# Patient Record
Sex: Female | Born: 2005
Health system: Southern US, Community
[De-identification: ages and names within clinical notes are randomized; demographics above are authoritative.]

## PROBLEM LIST (undated history)

## (undated) DIAGNOSIS — Z789 Other specified health status: Secondary | ICD-10-CM

## (undated) HISTORY — PX: TONSILLECTOMY: SUR1361

## (undated) HISTORY — PX: ADENOIDECTOMY: SUR15

## (undated) HISTORY — PX: NO PAST SURGERIES: SHX2092

## (undated) HISTORY — PX: NASAL SEPTUM SURGERY: SHX37

---
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
## 2023-07-15 ENCOUNTER — Ambulatory Visit (INDEPENDENT_AMBULATORY_CARE_PROVIDER_SITE_OTHER): Payer: 59

## 2023-07-15 VITALS — BP 114/56 | HR 64 | Ht 68.0 in | Wt 172.6 lb

## 2023-07-15 DIAGNOSIS — Z3201 Encounter for pregnancy test, result positive: Secondary | ICD-10-CM

## 2023-07-15 DIAGNOSIS — Z3A15 15 weeks gestation of pregnancy: Secondary | ICD-10-CM

## 2023-07-15 LAB — POCT URINE PREGNANCY: Preg Test, Ur: POSITIVE — AB

## 2023-07-15 NOTE — Progress Notes (Signed)
Susan Henderson here for a UPT. Pt had a positive upt at home. LMP is 03/31/2023 .     UPT in office Positive.    Reviewed medications and informed to start a PNV, if not already. Pt to follow up for New OB intake. Anatomy scan order placed given gestation age.

## 2023-07-27 ENCOUNTER — Inpatient Hospital Stay (HOSPITAL_COMMUNITY)
Admission: AD | Admit: 2023-07-27 | Discharge: 2023-07-28 | Disposition: A | Discharge status: Home / Self Care | Payer: 59 | Attending: Obstetrics & Gynecology | Admitting: Obstetrics & Gynecology

## 2023-07-27 DIAGNOSIS — Z3A17 17 weeks gestation of pregnancy: Secondary | ICD-10-CM | POA: Diagnosis not present

## 2023-07-27 DIAGNOSIS — S63502A Unspecified sprain of left wrist, initial encounter: Secondary | ICD-10-CM | POA: Diagnosis not present

## 2023-07-27 DIAGNOSIS — S66912A Strain of unspecified muscle, fascia and tendon at wrist and hand level, left hand, initial encounter: Secondary | ICD-10-CM | POA: Diagnosis not present

## 2023-07-27 DIAGNOSIS — O9A212 Injury, poisoning and certain other consequences of external causes complicating pregnancy, second trimester: Secondary | ICD-10-CM | POA: Diagnosis present

## 2023-07-27 DIAGNOSIS — W010XXA Fall on same level from slipping, tripping and stumbling without subsequent striking against object, initial encounter: Secondary | ICD-10-CM | POA: Diagnosis not present

## 2023-07-27 DIAGNOSIS — R0781 Pleurodynia: Secondary | ICD-10-CM | POA: Diagnosis not present

## 2023-07-27 DIAGNOSIS — W19XXXA Unspecified fall, initial encounter: Secondary | ICD-10-CM

## 2023-07-27 DIAGNOSIS — Z711 Person with feared health complaint in whom no diagnosis is made: Secondary | ICD-10-CM

## 2023-07-27 HISTORY — DX: Other specified health status: Z78.9
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
## 2023-07-28 ENCOUNTER — Inpatient Hospital Stay (HOSPITAL_COMMUNITY): Payer: 59

## 2023-07-28 ENCOUNTER — Encounter (HOSPITAL_COMMUNITY): Payer: Self-pay

## 2023-07-28 DIAGNOSIS — S66912A Strain of unspecified muscle, fascia and tendon at wrist and hand level, left hand, initial encounter: Secondary | ICD-10-CM | POA: Diagnosis not present

## 2023-07-28 DIAGNOSIS — Z3A17 17 weeks gestation of pregnancy: Secondary | ICD-10-CM

## 2023-07-28 DIAGNOSIS — O9A212 Injury, poisoning and certain other consequences of external causes complicating pregnancy, second trimester: Secondary | ICD-10-CM | POA: Diagnosis not present

## 2023-07-28 DIAGNOSIS — S63502A Unspecified sprain of left wrist, initial encounter: Secondary | ICD-10-CM

## 2023-07-28 DIAGNOSIS — W19XXXA Unspecified fall, initial encounter: Secondary | ICD-10-CM | POA: Diagnosis not present

## 2023-07-28 LAB — URINALYSIS, ROUTINE W REFLEX MICROSCOPIC
Bilirubin Urine: NEGATIVE
Glucose, UA: NEGATIVE mg/dL
Hgb urine dipstick: NEGATIVE
Ketones, ur: NEGATIVE mg/dL
Leukocytes,Ua: NEGATIVE
Nitrite: NEGATIVE
Protein, ur: NEGATIVE mg/dL
Specific Gravity, Urine: 1.025 (ref 1.005–1.030)
pH: 5 (ref 5.0–8.0)

## 2023-07-28 MED ORDER — CYCLOBENZAPRINE HCL 5 MG PO TABS
10.0000 mg | ORAL_TABLET | Freq: Once | ORAL | Status: AC
  Administered 2023-07-28: 10 mg via ORAL
  Filled 2023-07-28: qty 2

## 2023-07-28 MED ORDER — ACETAMINOPHEN 500 MG PO TABS
1000.0000 mg | ORAL_TABLET | Freq: Once | ORAL | Status: AC
  Administered 2023-07-28: 1000 mg via ORAL
  Filled 2023-07-28: qty 2

## 2023-07-28 NOTE — MAU Provider Note (Signed)
History     CSN: [id number]  Arrival date and time: 07/27/23 2215   Event Date/Time   First Provider Initiated Contact with Patient 07/28/23 0050      Chief Complaint  Patient presents with   Susan Henderson , a  17 y.o. G1P0 at [redacted]w[redacted]d presents to MAU with complaints of wrist and rib pain after a fall at work. Patient states that she slipped on water at work and fell on her butt. She states that when she landed she felt "the baby jump up into her rib cage." And since then she has been having rib pain and wrist pain. She states that she can barely move her wrist and it hurts to bend it. She denies attempting to relieve symptoms. She also denies vaginal bleeding, leaking of fluid, abnormal vagina discharge or urinary symptoms.        Fall Associated symptoms include abdominal pain. Pertinent negatives include no fever, headaches, nausea or vomiting.    OB History     Gravida  1   Para      Term      Preterm      AB      Living         SAB      IAB      Ectopic      Multiple      Live Births              No past medical history on file.  No past surgical history on file.  Family History  Problem Relation Age of Onset   Diabetes Maternal Grandfather     Social History   Tobacco Use   Smoking status: Never   Smokeless tobacco: Never  Vaping Use   Vaping status: Former  Substance Use Topics   Alcohol use: Not Currently   Drug use: Not Currently    Allergies: No Known Allergies  Medications Prior to Admission  Medication Sig Dispense Refill Last Dose   omeprazole (PRILOSEC) 20 MG capsule Take 1 capsule (20 mg total) by mouth daily. (Patient not taking: Reported on 07/15/2023) 30 capsule 0    Prenatal Vit-Fe Fumarate-FA (PRENATAL VITAMINS PO) Take by mouth.      sucralfate (CARAFATE) 1 GM/10ML suspension Take 10 mLs (1 g total) by mouth 4 (four) times daily -  with meals and at bedtime. (Patient not taking: Reported on 07/15/2023) 420 mL 0      Review of Systems  Constitutional:  Negative for chills, fatigue and fever.  Eyes:  Negative for pain and visual disturbance.  Respiratory:  Negative for apnea, shortness of breath and wheezing.   Cardiovascular:  Negative for chest pain and palpitations.  Gastrointestinal:  Positive for abdominal pain. Negative for constipation, diarrhea, nausea and vomiting.  Genitourinary:  Negative for difficulty urinating, dysuria, pelvic pain, vaginal bleeding, vaginal discharge and vaginal pain.  Musculoskeletal:  Negative for back pain.  Neurological:  Negative for seizures, weakness and headaches.  Psychiatric/Behavioral:  Negative for suicidal ideas.    Physical Exam   Blood pressure 118/65, pulse 86, temperature 98.9 F (37.2 C), temperature source Oral, resp. rate 15, height 5\' 8"  (1.727 m), weight 82.2 kg, last menstrual period 03/26/2023, SpO2 98%.  Physical Exam Vitals and nursing note reviewed.  Constitutional:      General: She is not in acute distress.    Appearance: Normal appearance.  HENT:     Head: Normocephalic.  Pulmonary:     Effort:  Pulmonary effort is normal.  Abdominal:     Palpations: Abdomen is soft.     Comments: No bruising noted to ribs or abdomen.   Musculoskeletal:     Left wrist: Swelling and tenderness present.       Arms:     Cervical back: Normal range of motion.     Comments: Tenderness to palpation noted on exam. Mild swelling noted. Patient able to wiggle fingers with ease.   Skin:    General: Skin is warm and dry.  Neurological:     Mental Status: She is alert and oriented to person, place, and time.  Psychiatric:        Mood and Affect: Mood normal.    FHT obtained in triage.   MAU Course  Procedures Orders Placed This Encounter  Procedures   DG Wrist Complete Left   Urinalysis, Routine w reflex microscopic -Urine, Clean Catch   Meds ordered this encounter  Medications   acetaminophen (TYLENOL) tablet 1,000 mg   cyclobenzaprine  (FLEXERIL) tablet 10 mg   Results for orders placed or performed during the hospital encounter of 07/27/23 (from the past 24 hour(s))  Urinalysis, Routine w reflex microscopic -Urine, Clean Catch     Status: Abnormal   Collection Time: 07/27/23 11:02 PM  Result Value Ref Range   Color, Urine YELLOW YELLOW   APPearance HAZY (A) CLEAR   Specific Gravity, Urine 1.025 1.005 - 1.030   pH 5.0 5.0 - 8.0   Glucose, UA NEGATIVE NEGATIVE mg/dL   Hgb urine dipstick NEGATIVE NEGATIVE   Bilirubin Urine NEGATIVE NEGATIVE   Ketones, ur NEGATIVE NEGATIVE mg/dL   Protein, ur NEGATIVE NEGATIVE mg/dL   Nitrite NEGATIVE NEGATIVE   Leukocytes,Ua NEGATIVE NEGATIVE   RBC / HPF 0-5 0 - 5 RBC/hpf   WBC, UA 6-10 0 - 5 WBC/hpf   Bacteria, UA RARE (A) NONE SEEN   Squamous Epithelial / HPF 0-5 0 - 5 /HPF   Mucus PRESENT    DG Wrist Complete Left  Result Date: 07/28/2023 CLINICAL DATA:  Recent fall with wrist pain, initial encounter EXAM: LEFT WRIST - COMPLETE 3+ VIEW COMPARISON:  12/15/2022 FINDINGS: There is no evidence of fracture or dislocation. There is no evidence of arthropathy or other focal bone abnormality. Soft tissues are unremarkable. IMPRESSION: No acute abnormality noted. Electronically Signed   By: Alcide Clever M.D.   On: 07/28/2023 01:31    MDM - No apparent break or fracture noted. Low suspicion for Broken bone. - plan for discharge.   Assessment and Plan   1. Sprain and strain of left wrist   2. [redacted] weeks gestation of pregnancy   3. Fall, initial encounter   4. Physically well but worried    - Reviewed results of likely wrist sprain from bracing her fall. Wrapped with ACE bandages prior to discharge.  - Encouraged the RICE method for comfort over the next few days.  - Also reviewed soreness expectations following fall. Encouraged warm epsom salt baths and PO Tylenol as needed for pain.  - Worsening signs and return precautions reviewed.  - Patient discharged home in stable condition and  may return to MAU as needed.    Claudette Head, MSN CNM  07/28/2023, 12:50 AM

## 2023-08-05 ENCOUNTER — Other Ambulatory Visit (HOSPITAL_COMMUNITY)
Admission: RE | Admit: 2023-08-05 | Discharge: 2023-08-05 | Disposition: A | Discharge status: Home / Self Care | Payer: 59 | Source: Ambulatory Visit | Attending: Obstetrics and Gynecology | Admitting: Obstetrics and Gynecology

## 2023-08-05 ENCOUNTER — Ambulatory Visit (INDEPENDENT_AMBULATORY_CARE_PROVIDER_SITE_OTHER): Payer: 59 | Admitting: Licensed Clinical Social Worker

## 2023-08-05 ENCOUNTER — Ambulatory Visit (INDEPENDENT_AMBULATORY_CARE_PROVIDER_SITE_OTHER): Payer: 59 | Admitting: Obstetrics and Gynecology

## 2023-08-05 ENCOUNTER — Encounter: Payer: Self-pay | Admitting: Obstetrics and Gynecology

## 2023-08-05 VITALS — BP 112/68 | HR 88 | Wt 185.0 lb

## 2023-08-05 DIAGNOSIS — Z3402 Encounter for supervision of normal first pregnancy, second trimester: Secondary | ICD-10-CM | POA: Insufficient documentation

## 2023-08-05 DIAGNOSIS — Z3A18 18 weeks gestation of pregnancy: Secondary | ICD-10-CM | POA: Diagnosis not present

## 2023-08-05 DIAGNOSIS — F4322 Adjustment disorder with anxiety: Secondary | ICD-10-CM

## 2023-08-05 DIAGNOSIS — Z3481 Encounter for supervision of other normal pregnancy, first trimester: Secondary | ICD-10-CM

## 2023-08-05 DIAGNOSIS — Z3403 Encounter for supervision of normal first pregnancy, third trimester: Secondary | ICD-10-CM | POA: Insufficient documentation
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
## 2023-08-06 LAB — CBC/D/PLT+RPR+RH+ABO+RUBIGG...
Antibody Screen: NEGATIVE
Basophils Absolute: 0 10*3/uL (ref 0.0–0.2)
Basos: 0 %
EOS (ABSOLUTE): 0 10*3/uL (ref 0.0–0.4)
Eos: 0 %
HCV Ab: NONREACTIVE
HIV Screen 4th Generation wRfx: NONREACTIVE
Hematocrit: 33.3 % — ABNORMAL LOW (ref 34.0–46.6)
Hemoglobin: 11.2 g/dL (ref 11.1–15.9)
Hepatitis B Surface Ag: NEGATIVE
Immature Grans (Abs): 0 10*3/uL (ref 0.0–0.1)
Immature Granulocytes: 0 %
Lymphocytes Absolute: 1.2 10*3/uL (ref 0.7–3.1)
Lymphs: 16 %
MCH: 30.4 pg (ref 26.6–33.0)
MCHC: 33.6 g/dL (ref 31.5–35.7)
MCV: 91 fL (ref 79–97)
Monocytes Absolute: 0.5 10*3/uL (ref 0.1–0.9)
Monocytes: 7 %
Neutrophils Absolute: 5.5 10*3/uL (ref 1.4–7.0)
Neutrophils: 77 %
Platelets: 194 10*3/uL (ref 150–450)
RBC: 3.68 x10E6/uL — ABNORMAL LOW (ref 3.77–5.28)
RDW: 12.7 % (ref 11.7–15.4)
RPR Ser Ql: NONREACTIVE
Rh Factor: POSITIVE
Rubella Antibodies, IGG: 1.07 {index} (ref >0.99)
WBC: 7.3 10*3/uL (ref 3.4–10.8)

## 2023-08-06 LAB — HCV INTERPRETATION

## 2023-08-07 LAB — AFP, SERUM, OPEN SPINA BIFIDA
AFP MoM: 0.67
AFP Value: 27.3 ng/mL
Gest. Age on Collection Date: 18.6 wk
Maternal Age At EDD: 19.2 a
OSBR Risk 1 IN: 10000
Test Results:: NEGATIVE
Weight: 185 [lb_av]

## 2023-08-07 LAB — CULTURE, OB URINE

## 2023-08-07 LAB — CERVICOVAGINAL ANCILLARY ONLY
Chlamydia: POSITIVE — AB
Comment: NEGATIVE
Comment: NORMAL
Neisseria Gonorrhea: NEGATIVE

## 2023-08-07 LAB — URINE CULTURE, OB REFLEX
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
## 2023-08-08 ENCOUNTER — Encounter: Payer: Self-pay | Admitting: Obstetrics and Gynecology

## 2023-08-08 DIAGNOSIS — O98319 Other infections with a predominantly sexual mode of transmission complicating pregnancy, unspecified trimester: Secondary | ICD-10-CM | POA: Insufficient documentation

## 2023-08-08 MED ORDER — AZITHROMYCIN 500 MG PO TABS
1000.0000 mg | ORAL_TABLET | Freq: Once | ORAL | 0 refills | Status: AC
Start: 2023-08-08 — End: 2023-08-08

## 2023-08-08 NOTE — Addendum Note (Signed)
Addended by: Harvie Bridge on: 08/08/2023 07:40 PM   Modules accepted: Orders

## 2023-08-11 LAB — PANORAMA PRENATAL TEST FULL PANEL:PANORAMA TEST PLUS 5 ADDITIONAL MICRODELETIONS: FETAL FRACTION: 7

## 2023-08-12 LAB — HORIZON CUSTOM: REPORT SUMMARY: NEGATIVE

## 2023-08-14 ENCOUNTER — Telehealth: Payer: Self-pay

## 2023-08-14 ENCOUNTER — Ambulatory Visit: Payer: 59 | Admitting: *Deleted

## 2023-08-14 ENCOUNTER — Other Ambulatory Visit: Payer: Self-pay | Admitting: *Deleted

## 2023-08-14 ENCOUNTER — Ambulatory Visit: Payer: 59 | Attending: Obstetrics & Gynecology

## 2023-08-14 VITALS — BP 118/61 | HR 82

## 2023-08-14 DIAGNOSIS — Z362 Encounter for other antenatal screening follow-up: Secondary | ICD-10-CM

## 2023-08-14 DIAGNOSIS — Z363 Encounter for antenatal screening for malformations: Secondary | ICD-10-CM | POA: Diagnosis not present

## 2023-08-14 DIAGNOSIS — Z3201 Encounter for pregnancy test, result positive: Secondary | ICD-10-CM | POA: Diagnosis not present

## 2023-08-14 DIAGNOSIS — Z3A15 15 weeks gestation of pregnancy: Secondary | ICD-10-CM

## 2023-08-14 DIAGNOSIS — Z3A20 20 weeks gestation of pregnancy: Secondary | ICD-10-CM | POA: Insufficient documentation

## 2023-08-14 DIAGNOSIS — Z3402 Encounter for supervision of normal first pregnancy, second trimester: Secondary | ICD-10-CM | POA: Insufficient documentation

## 2023-08-14 NOTE — Telephone Encounter (Signed)
Returned call and advised pt of +CT results, rx sent, treatment plan, follow up testing, and partner treatment. Pt voiced understanding.

## 2023-08-22 ENCOUNTER — Encounter (HOSPITAL_COMMUNITY): Payer: Self-pay | Admitting: Obstetrics and Gynecology

## 2023-08-22 ENCOUNTER — Inpatient Hospital Stay (HOSPITAL_COMMUNITY)
Admission: AD | Admit: 2023-08-22 | Discharge: 2023-08-22 | Disposition: A | Discharge status: Home / Self Care | Payer: 59 | Attending: Obstetrics and Gynecology | Admitting: Obstetrics and Gynecology

## 2023-08-22 DIAGNOSIS — O2342 Unspecified infection of urinary tract in pregnancy, second trimester: Secondary | ICD-10-CM

## 2023-08-22 DIAGNOSIS — Z3402 Encounter for supervision of normal first pregnancy, second trimester: Secondary | ICD-10-CM

## 2023-08-22 DIAGNOSIS — R1030 Lower abdominal pain, unspecified: Secondary | ICD-10-CM | POA: Diagnosis present

## 2023-08-22 DIAGNOSIS — O26892 Other specified pregnancy related conditions, second trimester: Secondary | ICD-10-CM | POA: Diagnosis not present

## 2023-08-22 DIAGNOSIS — N390 Urinary tract infection, site not specified: Secondary | ICD-10-CM | POA: Insufficient documentation

## 2023-08-22 DIAGNOSIS — R1033 Periumbilical pain: Secondary | ICD-10-CM | POA: Diagnosis not present

## 2023-08-22 DIAGNOSIS — Z3A21 21 weeks gestation of pregnancy: Secondary | ICD-10-CM

## 2023-08-22 LAB — URINALYSIS, ROUTINE W REFLEX MICROSCOPIC
Bilirubin Urine: NEGATIVE
Glucose, UA: NEGATIVE mg/dL
Hgb urine dipstick: NEGATIVE
Ketones, ur: 20 mg/dL — AB
Nitrite: NEGATIVE
Protein, ur: NEGATIVE mg/dL
Specific Gravity, Urine: 1.027 (ref 1.005–1.030)
WBC, UA: >50 WBC/hpf (ref 0–5)
pH: 5 (ref 5.0–8.0)

## 2023-08-22 LAB — COMPREHENSIVE METABOLIC PANEL
ALT: 9 U/L (ref 0–44)
AST: 12 U/L — ABNORMAL LOW (ref 15–41)
Albumin: 3.3 g/dL — ABNORMAL LOW (ref 3.5–5.0)
Alkaline Phosphatase: 43 U/L (ref 38–126)
Anion gap: 9 (ref 5–15)
BUN: 8 mg/dL (ref 6–20)
CO2: 22 mmol/L (ref 22–32)
Calcium: 8.5 mg/dL — ABNORMAL LOW (ref 8.9–10.3)
Chloride: 104 mmol/L (ref 98–111)
Creatinine, Ser: 0.59 mg/dL (ref 0.44–1.00)
GFR, Estimated: >60 mL/min (ref >60)
Glucose, Bld: 95 mg/dL (ref 70–99)
Potassium: 3.2 mmol/L — ABNORMAL LOW (ref 3.5–5.1)
Sodium: 135 mmol/L (ref 135–145)
Total Bilirubin: 0.6 mg/dL (ref 0.3–1.2)
Total Protein: 6 g/dL — ABNORMAL LOW (ref 6.5–8.1)

## 2023-08-22 LAB — CBC
HCT: 32 % — ABNORMAL LOW (ref 36.0–46.0)
Hemoglobin: 10.7 g/dL — ABNORMAL LOW (ref 12.0–15.0)
MCH: 30.1 pg (ref 26.0–34.0)
MCHC: 33.4 g/dL (ref 30.0–36.0)
MCV: 90.1 fL (ref 80.0–100.0)
Platelets: 204 10*3/uL (ref 150–400)
RBC: 3.55 MIL/uL — ABNORMAL LOW (ref 3.87–5.11)
RDW: 13.4 % (ref 11.5–15.5)
WBC: 8.4 10*3/uL (ref 4.0–10.5)
nRBC: 0 % (ref 0.0–0.2)

## 2023-08-22 LAB — LIPASE, BLOOD: Lipase: 24 U/L (ref 11–51)

## 2023-08-22 LAB — AMYLASE: Amylase: 67 U/L (ref 28–100)

## 2023-08-22 LAB — WET PREP, GENITAL
Clue Cells Wet Prep HPF POC: NONE SEEN
Sperm: NONE SEEN
Trich, Wet Prep: NONE SEEN
WBC, Wet Prep HPF POC: >=10 — AB (ref <10)
Yeast Wet Prep HPF POC: NONE SEEN

## 2023-08-22 MED ORDER — ACETAMINOPHEN 500 MG PO TABS
1000.0000 mg | ORAL_TABLET | Freq: Once | ORAL | Status: AC
  Administered 2023-08-22: 1000 mg via ORAL
  Filled 2023-08-22: qty 2

## 2023-08-22 MED ORDER — NITROFURANTOIN MONOHYD MACRO 100 MG PO CAPS: 100.0000 mg | ORAL_CAPSULE | Freq: Two times a day (BID) | ORAL | 0 refills | Status: AC

## 2023-08-22 NOTE — MAU Note (Addendum)
.  Susan Henderson is a 17 y.o. at [redacted]w[redacted]d here in MAU reporting abdominal pains since 1445. Pain improved alittle earlier but now is back. Pain is sharp in lower abdomen and stays tight. Makes it alittle hard to breathe. Pain is worse with movement. She was at work and very busy and once she could rest alittle pain did decrease. Denies VB or LOF  Onset of complaint: 1445 Pain score: 8 Vitals:   08/22/23 0021 08/22/23 0025  BP:  (!) 124/59  Pulse: 61   Resp: 16   Temp: 98 F (36.7 C)   SpO2: 100%      FHT:133 Lab orders placed from triage:  u/a

## 2023-08-22 NOTE — Progress Notes (Signed)
Dr Lucianne Muss in to discuss test results and d/c plan. Written and verbal d/c instructions given and understanding voiced.

## 2023-08-22 NOTE — MAU Provider Note (Addendum)
History     CSN: [id number]  Arrival date and time: 08/22/23 0009   Event Date/Time   First Provider Initiated Contact with Patient 08/22/23 [id number]      Chief Complaint  Patient presents with   Abdominal Pain   HPI Susan Henderson is a 17 y.o. G1P0 at [redacted]w[redacted]d who presents for abdominal pain. She reports ongoing abdominal pain throughout today. She describes it as a stabbing pain that had started around 1445 and lasted until 1700, she had another episode from 1800-1900, which later subsided. She reports tenderness to touch of entire abdomen. Her cramping episodes feel like suprapubic pain that goes down to her vaginal area. She denies any vaginal bleeding, leaking of fluid, or decreased fetal movement. Has some nausea, but no fevers, chills, constipation, diarrhea, dysuria. She has not had any decreased PO intake and reports drinking approx 24oz of water total throughout the day.   Past Medical History:  Diagnosis Date   Medical history non-contributory     Past Surgical History:  Procedure Laterality Date   NO PAST SURGERIES      Family History  Problem Relation Age of Onset   Cancer Maternal Grandfather    Diabetes Maternal Grandfather    Hypertension Other     Social History   Tobacco Use   Smoking status: Never   Smokeless tobacco: Never  Vaping Use   Vaping status: Former  Substance Use Topics   Alcohol use: Not Currently   Drug use: Not Currently    Allergies: No Known Allergies  Medications Prior to Admission  Medication Sig Dispense Refill Last Dose   Prenatal Vit-Fe Fumarate-FA (PRENATAL VITAMINS PO) Take by mouth.   08/21/2023    ROS reviewed and pertinent positives and negatives as documented in HPI.  Physical Exam   Blood pressure (!) 124/59, pulse 61, temperature 98 F (36.7 C), resp. rate 16, height 5\' 8"  (1.727 m), weight 85.3 kg, last menstrual period 03/26/2023, SpO2 100%.  Physical Exam Exam conducted with a chaperone present.  Constitutional:       General: She is not in acute distress.    Appearance: Normal appearance.  HENT:     Head: Normocephalic and atraumatic.  Cardiovascular:     Rate and Rhythm: Normal rate and regular rhythm.     Heart sounds: Normal heart sounds.  Pulmonary:     Effort: Pulmonary effort is normal. No respiratory distress.     Breath sounds: Normal breath sounds.  Abdominal:     General: There is no distension.     Palpations: Abdomen is soft.     Tenderness: There is no abdominal tenderness. There is no right CVA tenderness or left CVA tenderness.     Comments: Tenderness to light palpation of suprapubic area  Genitourinary:    Vagina: Normal.     Comments: On SSE, cervix is closed with white to yellowish thick discharge Musculoskeletal:        General: Normal range of motion.  Skin:    General: Skin is warm and dry.     Findings: No rash.  Neurological:     General: No focal deficit present.     Mental Status: She is alert and oriented to person, place, and time.     MAU Course  Procedures  MDM 17 y.o. G1P0 at [redacted]w[redacted]d who presents with complaint of suprapubic tenderness that has been intermittent in nature. Vital signs are stable on arrival to MAU, exam is benign, cervix closed on SSE. Labs and  wet prep sent. Will give Tylenol, push PO hydration and reassess.  2:20 AM  Pt reports sxs better w rest, Tylenol. Labs back -- CBC, CMP, lipase wnl. Wet prep negative. UA c/f UTI -- will tx with course of Macrobid. Results d/w pt. Stable for d/c.   Assessment and Plan  UTI (urinary tract infection) during pregnancy, second trimester: suprapubic pain + UA w bacteria, sig number of leuks, neg nitrites -- will tx with Macrobid, cx sent  d/c at this time  Patient discussed with Dr. Lovie Chol, MD OB Fellow, Faculty [facility], Center for [facility]  08/22/2023, 2:21 AM

## 2023-08-23 LAB — CULTURE, OB URINE: Culture: <10000 — AB

## 2023-08-26 ENCOUNTER — Telehealth: Payer: Self-pay | Admitting: *Deleted

## 2023-08-26 NOTE — Telephone Encounter (Signed)
Breat pump order was signed and faxed back to [facility].

## 2023-09-09 DIAGNOSIS — Z3400 Encounter for supervision of normal first pregnancy, unspecified trimester: Secondary | ICD-10-CM | POA: Insufficient documentation

## 2023-09-10 ENCOUNTER — Other Ambulatory Visit (HOSPITAL_COMMUNITY)
Admission: RE | Admit: 2023-09-10 | Discharge: 2023-09-10 | Disposition: A | Discharge status: Home / Self Care | Payer: 59 | Source: Ambulatory Visit | Attending: Obstetrics and Gynecology | Admitting: Obstetrics and Gynecology

## 2023-09-10 ENCOUNTER — Ambulatory Visit (INDEPENDENT_AMBULATORY_CARE_PROVIDER_SITE_OTHER): Payer: 59 | Admitting: Obstetrics and Gynecology

## 2023-09-10 VITALS — BP 121/74 | HR 92 | Wt 193.7 lb

## 2023-09-10 DIAGNOSIS — Z113 Encounter for screening for infections with a predominantly sexual mode of transmission: Secondary | ICD-10-CM | POA: Insufficient documentation

## 2023-09-10 DIAGNOSIS — A749 Chlamydial infection, unspecified: Secondary | ICD-10-CM | POA: Insufficient documentation

## 2023-09-10 DIAGNOSIS — O99012 Anemia complicating pregnancy, second trimester: Secondary | ICD-10-CM

## 2023-09-10 DIAGNOSIS — O0992 Supervision of high risk pregnancy, unspecified, second trimester: Secondary | ICD-10-CM | POA: Diagnosis not present

## 2023-09-10 DIAGNOSIS — Z3A24 24 weeks gestation of pregnancy: Secondary | ICD-10-CM | POA: Insufficient documentation

## 2023-09-10 DIAGNOSIS — O98812 Other maternal infectious and parasitic diseases complicating pregnancy, second trimester: Secondary | ICD-10-CM | POA: Diagnosis present

## 2023-09-10 DIAGNOSIS — O99019 Anemia complicating pregnancy, unspecified trimester: Secondary | ICD-10-CM | POA: Insufficient documentation

## 2023-09-10 DIAGNOSIS — O99892 Other specified diseases and conditions complicating childbirth: Secondary | ICD-10-CM | POA: Insufficient documentation

## 2023-09-10 DIAGNOSIS — Z8619 Personal history of other infectious and parasitic diseases: Secondary | ICD-10-CM | POA: Insufficient documentation

## 2023-09-10 MED ORDER — PRENATAL 28-0.8 MG PO TABS
1.0000 | ORAL_TABLET | Freq: Every day | ORAL | 6 refills | Status: AC
Start: 2023-09-10 — End: ?

## 2023-09-10 NOTE — Progress Notes (Signed)
    PRENATAL VISIT NOTE  Subjective:  Susan Henderson is a 17 y.o. G1P0 at [redacted]w[redacted]d being seen today for ongoing prenatal care.  She is currently monitored for the following issues for this low-risk pregnancy and has Encounter for supervision of normal first pregnancy in second trimester; Chlamydia trachomatis infection in pregnancy; Supervision of normal first teen pregnancy; Anemia affecting pregnancy; and Chlamydia infection affecting pregnancy in second trimester on their problem list.  Patient reports no bleeding, no contractions, no cramping, and no leaking.  Contractions: Not present. Vag. Bleeding: None.  Movement: Present. Denies leaking of fluid.   The following portions of the patient's history were reviewed and updated as appropriate: allergies, current medications, past family history, past medical history, past social history, past surgical history and problem list.   Objective:   Vitals:   09/10/23 1335  BP: 121/74  Pulse: 92  Weight: 193 lb 11.2 oz (87.9 kg)    Fetal Status: Fetal Heart Rate (bpm): 143   Movement: Present     General:  Alert, oriented and cooperative. Patient is in no acute distress.  Skin: Skin is warm and dry. No rash noted.   Cardiovascular: Normal heart rate noted  Respiratory: Normal respiratory effort, no problems with respiration noted  Abdomen: Soft, gravid, appropriate for gestational age.  Pain/Pressure: Absent     Pelvic: Cervical exam deferred        Extremities: Normal range of motion.  Edema: Trace  Mental Status: Normal mood and affect. Normal behavior. Normal judgment and thought content.   Assessment and Plan:  Pregnancy: G1P0 at [redacted]w[redacted]d 1. Anemia affecting pregnancy in second trimester -Continue iron supplement  2. Chlamydia infection affecting pregnancy in second trimester Pt states completed therapy. Test of Cure collected. - Cervicovaginal ancillary only( Susan Henderson)  3. [redacted] weeks gestation of pregnancy -Discussed 2hr GTT/labs at  next ROB in 4 weeks  Preterm labor symptoms and general obstetric precautions including but not limited to vaginal bleeding, contractions, leaking of fluid and fetal movement were reviewed in detail with the patient. Please refer to After Visit Summary for other counseling recommendations.   Return in about 4 weeks (around 10/08/2023) for 2hr GTT, Routine prenatal visit.  Future Appointments  Date Time Provider Department Center  09/18/2023  9:30 AM [facility]    Letta Kocher, CNM

## 2023-09-11 LAB — CERVICOVAGINAL ANCILLARY ONLY
Bacterial Vaginitis (gardnerella): NEGATIVE
Candida Glabrata: NEGATIVE
Candida Vaginitis: NEGATIVE
Chlamydia: NEGATIVE
Comment: NEGATIVE
Comment: NEGATIVE
Comment: NEGATIVE
Comment: NEGATIVE
Comment: NEGATIVE
Comment: NORMAL
Neisseria Gonorrhea: NEGATIVE
Trichomonas: NEGATIVE

## 2023-09-18 ENCOUNTER — Other Ambulatory Visit: Payer: Self-pay

## 2023-09-18 ENCOUNTER — Other Ambulatory Visit: Payer: Self-pay | Admitting: *Deleted

## 2023-09-18 ENCOUNTER — Ambulatory Visit: Payer: 59 | Attending: Obstetrics

## 2023-09-18 VITALS — BP 113/52 | HR 59

## 2023-09-18 DIAGNOSIS — Z362 Encounter for other antenatal screening follow-up: Secondary | ICD-10-CM | POA: Diagnosis present

## 2023-09-18 DIAGNOSIS — O365920 Maternal care for other known or suspected poor fetal growth, second trimester, not applicable or unspecified: Secondary | ICD-10-CM | POA: Diagnosis not present

## 2023-09-18 DIAGNOSIS — O321XX0 Maternal care for breech presentation, not applicable or unspecified: Secondary | ICD-10-CM | POA: Insufficient documentation

## 2023-09-18 DIAGNOSIS — Z3400 Encounter for supervision of normal first pregnancy, unspecified trimester: Secondary | ICD-10-CM | POA: Insufficient documentation

## 2023-09-18 DIAGNOSIS — Z3A25 25 weeks gestation of pregnancy: Secondary | ICD-10-CM | POA: Diagnosis not present

## 2023-09-18 DIAGNOSIS — Z363 Encounter for antenatal screening for malformations: Secondary | ICD-10-CM | POA: Diagnosis not present

## 2023-09-18 DIAGNOSIS — O09892 Supervision of other high risk pregnancies, second trimester: Secondary | ICD-10-CM

## 2023-10-08 ENCOUNTER — Ambulatory Visit (INDEPENDENT_AMBULATORY_CARE_PROVIDER_SITE_OTHER): Payer: 59 | Admitting: Obstetrics and Gynecology

## 2023-10-08 ENCOUNTER — Other Ambulatory Visit: Payer: 59

## 2023-10-08 VITALS — BP 101/67 | HR 78 | Wt 200.0 lb

## 2023-10-08 DIAGNOSIS — Z3A28 28 weeks gestation of pregnancy: Secondary | ICD-10-CM

## 2023-10-08 DIAGNOSIS — Z3403 Encounter for supervision of normal first pregnancy, third trimester: Secondary | ICD-10-CM

## 2023-10-08 DIAGNOSIS — Z3402 Encounter for supervision of normal first pregnancy, second trimester: Secondary | ICD-10-CM

## 2023-10-08 NOTE — Progress Notes (Signed)
   PRENATAL VISIT NOTE  Subjective:  Susan Henderson is a 17 y.o. G1P0 at [redacted]w[redacted]d being seen today for ongoing prenatal care.  She is currently monitored for the following issues for this low-risk pregnancy and has Encounter for supervision of normal first pregnancy in third trimester; Chlamydia trachomatis infection in pregnancy; Supervision of normal first teen pregnancy; Anemia affecting pregnancy; and Chlamydia infection affecting pregnancy in second trimester on their problem list.  Patient doing well with no acute concerns today. She reports no complaints.  Contractions: Not present. Vag. Bleeding: None.  Movement: Present. Denies leaking of fluid.   The following portions of the patient's history were reviewed and updated as appropriate: allergies, current medications, past family history, past medical history, past social history, past surgical history and problem list. Problem list updated.  Objective:   Vitals:   10/08/23 0944  BP: 101/67  Pulse: 78  Weight: 200 lb (90.7 kg)    Fetal Status: Fetal Heart Rate (bpm): 150 Fundal Height: 27 cm Movement: Present     General:  Alert, oriented and cooperative. Patient is in no acute distress.  Skin: Skin is warm and dry. No rash noted.   Cardiovascular: Normal heart rate noted  Respiratory: Normal respiratory effort, no problems with respiration noted  Abdomen: Soft, gravid, appropriate for gestational age.  Pain/Pressure: Present     Pelvic: Cervical exam deferred        Extremities: Normal range of motion.  Edema: Trace  Mental Status:  Normal mood and affect. Normal behavior. Normal judgment and thought content.   Assessment and Plan:  Pregnancy: G1P0 at [redacted]w[redacted]d  1. Encounter for supervision of normal first pregnancy in second trimester Pt had to reschedule GTT due to chewing gum - Glucose Tolerance, 2 Hours w/1 Hour - RPR - CBC - HIV antibody (with reflex)  2. [redacted] weeks gestation of pregnancy   3. Supervision of normal  first teen pregnancy in third trimester   4. Encounter for supervision of normal first pregnancy in third trimester Continue routine prenatal care - Ambulatory referral to Integrated Behavioral Health  Preterm labor symptoms and general obstetric precautions including but not limited to vaginal bleeding, contractions, leaking of fluid and fetal movement were reviewed in detail with the patient.  Please refer to After Visit Summary for other counseling recommendations.   Return in about 2 weeks (around 10/22/2023) for ROB, in person.   Mariel Aloe, MD Faculty Attending Center for [facility]

## 2023-10-09 LAB — CBC
Hematocrit: 34.2 % (ref 34.0–46.6)
Hemoglobin: 11.1 g/dL (ref 11.1–15.9)
MCH: 29.7 pg (ref 26.6–33.0)
MCHC: 32.5 g/dL (ref 31.5–35.7)
MCV: 91 fL (ref 79–97)
Platelets: 222 10*3/uL (ref 150–450)
RBC: 3.74 x10E6/uL — ABNORMAL LOW (ref 3.77–5.28)
RDW: 12.1 % (ref 11.7–15.4)
WBC: 9.8 10*3/uL (ref 3.4–10.8)

## 2023-10-09 LAB — HIV ANTIBODY (ROUTINE TESTING W REFLEX): HIV Screen 4th Generation wRfx: NONREACTIVE

## 2023-10-09 LAB — RPR: RPR Ser Ql: NONREACTIVE

## 2023-10-10 ENCOUNTER — Other Ambulatory Visit: Payer: 59

## 2023-10-10 DIAGNOSIS — Z3A28 28 weeks gestation of pregnancy: Secondary | ICD-10-CM

## 2023-10-11 LAB — GLUCOSE TOLERANCE, 2 HOURS W/ 1HR
Glucose, 1 hour: 68 mg/dL — ABNORMAL LOW (ref 70–179)
Glucose, 2 hour: 56 mg/dL — ABNORMAL LOW (ref 70–152)
Glucose, Fasting: 69 mg/dL — ABNORMAL LOW (ref 70–91)

## 2023-10-23 ENCOUNTER — Other Ambulatory Visit: Payer: Self-pay

## 2023-10-23 ENCOUNTER — Ambulatory Visit: Payer: 59 | Attending: Maternal & Fetal Medicine

## 2023-10-23 DIAGNOSIS — O09892 Supervision of other high risk pregnancies, second trimester: Secondary | ICD-10-CM | POA: Insufficient documentation

## 2023-10-23 DIAGNOSIS — Z3A30 30 weeks gestation of pregnancy: Secondary | ICD-10-CM | POA: Diagnosis not present

## 2023-10-23 DIAGNOSIS — O358XX0 Maternal care for other (suspected) fetal abnormality and damage, not applicable or unspecified: Secondary | ICD-10-CM

## 2023-10-24 ENCOUNTER — Ambulatory Visit (INDEPENDENT_AMBULATORY_CARE_PROVIDER_SITE_OTHER): Payer: 59 | Admitting: Certified Nurse Midwife

## 2023-10-24 VITALS — BP 128/68 | HR 73 | Wt 208.6 lb

## 2023-10-24 DIAGNOSIS — O0993 Supervision of high risk pregnancy, unspecified, third trimester: Secondary | ICD-10-CM

## 2023-10-24 DIAGNOSIS — Z3A30 30 weeks gestation of pregnancy: Secondary | ICD-10-CM

## 2023-10-24 DIAGNOSIS — O09893 Supervision of other high risk pregnancies, third trimester: Secondary | ICD-10-CM

## 2023-10-24 NOTE — Progress Notes (Signed)
   PRENATAL VISIT NOTE  Subjective:  Susan Henderson is a 17 y.o. G1P0 at [redacted]w[redacted]d being seen today for ongoing prenatal care.  She is currently monitored for the following issues for this low-risk pregnancy and has Encounter for supervision of normal first pregnancy in third trimester; Chlamydia trachomatis infection in pregnancy; Supervision of normal first teen pregnancy; Anemia affecting pregnancy; and Chlamydia infection affecting pregnancy in second trimester on their problem list.  Patient reports intermittent cramping. Contractions: Not present. Vag. Bleeding: None.  Movement: Present. Denies leaking of fluid. Patient reports some DFM last Friday & she did not notice until after she left work, but reports that there has been no issues since and baby has been very active.  The following portions of the patient's history were reviewed and updated as appropriate: allergies, current medications, past family history, past medical history, past social history, past surgical history and problem list.   Objective:   Vitals:   10/24/23 1015  BP: 128/68  Pulse: 73  Weight: 94.6 kg    Fetal Status: Fetal Heart Rate (bpm): 143   Movement: Present     General:  Alert, oriented and cooperative. Patient is in no acute distress.  Skin: Skin is warm and dry. No rash noted.      Respiratory: Normal respiratory effort, no problems with respiration noted  Abdomen: Soft, gravid, appropriate for gestational age.  Pain/Pressure: Absent     Pelvic: Cervical exam deferred        Extremities: Normal range of motion.  Edema: None  Mental Status: Normal mood and affect. Normal behavior. Normal judgment and thought content.   Assessment and Plan:  Pregnancy: G1P0 at [redacted]w[redacted]d 1. Supervision of high risk pregnancy in third trimester -Reports some life stressors but states she is in good spirits and has a lot of family support  2. [redacted] weeks gestation of pregnancy -28 wks labs reviewed. Nothing to address  today.  3. High risk teen pregnancy in third trimester -Expectant management given & reviewed -Patient works long hours at a [employer]  Preterm labor symptoms and general obstetric precautions including but not limited to vaginal bleeding, contractions, leaking of fluid and fetal movement were reviewed in detail with the patient. Please refer to After Visit Summary for other counseling recommendations.   Return in about 2 weeks (around 11/07/2023) for HROB visit.  Future Appointments  Date Time Provider Department Center  11/06/2023  9:35 AM Gerrit Heck, CNM [facility] None  11/21/2023  9:55 AM Constant, Gigi Gin, MD [facility] None  12/05/2023  9:55 AM Sue Lush, FNP [facility] None  12/12/2023  9:55 AM Lennart Pall, MD [facility] None  12/19/2023  9:55 AM Allie Bossier, MD [facility] None  12/26/2023  9:55 AM Carlynn Herald, CNM [facility] None  01/02/2024  9:55 AM Lennart Pall, MD [facility] None    Darrell Jewel, Student-MidWife

## 2023-10-24 NOTE — Progress Notes (Unsigned)
Pt presents for ROB visit. Pt reports abd cramping

## 2023-10-25 ENCOUNTER — Encounter: Payer: Self-pay | Admitting: Obstetrics and Gynecology

## 2023-10-25 ENCOUNTER — Encounter: Payer: Self-pay | Admitting: Certified Nurse Midwife

## 2023-11-06 ENCOUNTER — Ambulatory Visit (INDEPENDENT_AMBULATORY_CARE_PROVIDER_SITE_OTHER): Payer: 59

## 2023-11-06 ENCOUNTER — Ambulatory Visit: Payer: Self-pay | Admitting: Licensed Clinical Social Worker

## 2023-11-06 ENCOUNTER — Telehealth: Payer: Self-pay | Admitting: Licensed Clinical Social Worker

## 2023-11-06 VITALS — BP 123/74 | HR 101 | Wt 213.8 lb

## 2023-11-06 DIAGNOSIS — Z3A32 32 weeks gestation of pregnancy: Secondary | ICD-10-CM | POA: Diagnosis not present

## 2023-11-06 DIAGNOSIS — O99013 Anemia complicating pregnancy, third trimester: Secondary | ICD-10-CM | POA: Diagnosis not present

## 2023-11-06 DIAGNOSIS — Z3403 Encounter for supervision of normal first pregnancy, third trimester: Secondary | ICD-10-CM

## 2023-11-06 DIAGNOSIS — Z91199 Patient's noncompliance with other medical treatment and regimen due to unspecified reason: Secondary | ICD-10-CM

## 2023-11-06 DIAGNOSIS — O99019 Anemia complicating pregnancy, unspecified trimester: Secondary | ICD-10-CM

## 2023-11-06 NOTE — BH Specialist Note (Signed)
Patient no showed for today's appointment. A Virtual link was sent to patient's phone. [facility] also contacted patient to remind her of the appointment and left a VM.

## 2023-11-06 NOTE — Patient Instructions (Signed)
Deciding about Circumcision in Baby Boys  (The Basics)  What is circumcision?   Circumcision is a surgery that removes the skin that covers the tip of the penis, called the "foreskin" Circumcision is usually done when a boy is between 1 and 10 days old. In the [place], circumcision is common. In some other countries, fewer boys are circumcised. Circumcision is a common tradition in some religions.  Should I have my baby boy circumcised?   There is no easy answer. Circumcision has some benefits. But it also has risks. After talking with your doctor, you will have to decide for yourself what is right for your family.  What are the benefits of circumcision?   Circumcised boys seem to have slightly lower rates of: ?Urinary tract infections ?Swelling of the opening at the tip of the penis Circumcised men seem to have slightly lower rates of: ?Urinary tract infections ?Swelling of the opening at the tip of the penis ?Penis cancer ?HIV and other infections that you catch during sex ?Cervical cancer in the women they have sex with Even so, in the [place], the risks of these problems are small - even in boys and men who have not been circumcised. Plus, boys and men who are not circumcised can reduce these extra risks by: ?Cleaning their penis well ?Using condoms during sex  What are the risks of circumcision?  Risks include: ?Bleeding or infection from the surgery ?Damage to or amputation of the penis ?A chance that the doctor will cut off too much or not enough of the foreskin ?A chance that sex won't feel as good later in life Only about 1 out of every 200 circumcisions leads to problems. There is also a chance that your health insurance won't pay for circumcision.  How is circumcision done in baby boys?  First, the baby gets medicine for pain relief. This might be a cream on the skin or a shot into the base of the penis. Next, the doctor cleans the baby's penis well. Then  he or she uses special tools to cut off the foreskin. Finally, the doctor wraps a bandage (called gauze) around the baby's penis. If you have your baby circumcised, his doctor or nurse will give you instructions on how to care for him after the surgery. It is important that you follow those instructions carefully.  

## 2023-11-06 NOTE — Progress Notes (Signed)
   LOW-RISK PREGNANCY OFFICE VISIT  Patient name: Susan Henderson MRN [id number]  Date of birth: 09/14/2006 Chief Complaint:   Routine Prenatal Visit  Subjective:   Susan Henderson is a 17 y.o. G1P0 female at [redacted]w[redacted]d with an Estimated Date of Delivery: 12/31/23 being seen today for ongoing management of a low-risk pregnancy aeb has Encounter for supervision of normal first pregnancy in third trimester; Chlamydia trachomatis infection in pregnancy; Supervision of normal first teen pregnancy; Anemia affecting pregnancy; and Chlamydia infection affecting pregnancy in second trimester on their problem list.  Patient presents today, with her mother, with no complaints.  Patient endorses fetal movement. Patient denies abdominal cramping or contractions.  Patient denies vaginal concerns including abnormal discharge, leaking of fluid, and bleeding. No issues with urination, constipation, or diarrhea.    Contractions: Irritability. Vag. Bleeding: None.  Movement: Present.  Reviewed past medical,surgical, social, obstetrical and family history as well as problem list, medications and allergies.  Objective   Vitals:   11/06/23 0950  BP: 123/74  Pulse: (!) 101  Weight: 213 lb 12.8 oz (97 kg)  Body mass index is 32.51 kg/m.  Total Weight Gain:35 lb 12.8 oz (16.2 kg)         Physical Examination:   General appearance: Well appearing, and in no distress  Mental status: Alert, oriented to person, place, and time  Skin: Warm & dry  Cardiovascular: Normal heart rate noted  Respiratory: Normal respiratory effort, no distress  Abdomen: Soft, gravid, nontender, AGA with Fundal Height: 33 cm  Pelvic: Cervical exam deferred           Extremities: Edema: Trace  Fetal Status: Fetal Heart Rate (bpm): 126  Movement: Present   No results found for this or any previous visit (from the past 24 hour(s)).  Assessment & Plan:  Low-risk pregnancy of a 17 y.o., G1P0 at [redacted]w[redacted]d with an Estimated Date of Delivery:  12/31/23   1. Encounter for supervision of normal first pregnancy in third trimester -Anticipatory guidance for upcoming appts. -Patient to schedule next appt in 2 weeks for an in-person visit.   2. Anemia affecting pregnancy, antepartum -HgB level 11.1 at 28 weeks  3. [redacted] weeks gestation of pregnancy -Scheduled for CB class -Will likely pump and bottlefeed. -Expecting female infant. Circumcision desired. Consent completed. -Peds: [facility].  Informed that infant will be initially assessed by hospital pediatric services and it will be patient's responsibility to notify her office of delivery and schedule follow up.       Meds: No orders of the defined types were placed in this encounter.  Labs/procedures today:  Lab Orders  No laboratory test(s) ordered today     Reviewed: Preterm labor symptoms and general obstetric precautions including but not limited to vaginal bleeding, contractions, leaking of fluid and fetal movement were reviewed in detail with the patient.  All questions were answered.  Follow-up: Return in about 2 weeks (around 11/20/2023) for LROB.  No orders of the defined types were placed in this encounter.  Cherre Robins MSN, CNM 11/06/2023

## 2023-11-06 NOTE — Telephone Encounter (Signed)
[facility] contacted patient to remind her of today's appointment. [facility] left a VM.

## 2023-11-06 NOTE — Progress Notes (Signed)
   CIRCUMCISION CONSENT  Patient, Susan Henderson, expresses desire for circumcision of female infant with Estimated Date of Delivery: 12/31/23.  Informed that [facility] can perform said procedure and circumcision procedure details discussed.    -It was emphasized that this is an elective procedure.   -Risks and benefits of procedure were reviewed including, but not limited to:  *Benefits include reduction in the rates of urinary tract infection (UTI), penile cancer, some sexually transmitted infections, penile inflammatory, and retractile disorders, as well as easier hygiene.   *Risks include bleeding, infection, injury of glans which may lead to need for additional surgery, penile deformity, or urinary tract issues, unsatisfactory cosmetic appearance and other potential complications related to the procedure.   -Informed that procedure will not be performed if provider deems inappropriate d/t penile size, noted deformity, or unsatisfactory pediatric evaluation. -Circumcision to be done pending satisfactory pediatric evaluation of infant after delivery.  -Post circumcision care discussed. -Patient informed that circumcision can be refused despite prior consent. -Patient verbalizes understanding and without current questions or concerns.  -Informational sheet placed in AVS for patient review.   Cherre Robins MSN, CNM Advanced Practice Provider, Center for [facility] 11/06/2023 1:09 PM

## 2023-11-09 ENCOUNTER — Inpatient Hospital Stay (HOSPITAL_COMMUNITY)
Admission: RE | Admit: 2023-11-09 | Discharge: 2023-11-09 | Disposition: A | Discharge status: Home / Self Care | Payer: 59 | Attending: Obstetrics & Gynecology | Admitting: Obstetrics & Gynecology

## 2023-11-09 ENCOUNTER — Encounter (HOSPITAL_COMMUNITY): Payer: Self-pay | Admitting: Obstetrics and Gynecology

## 2023-11-09 DIAGNOSIS — Z3A32 32 weeks gestation of pregnancy: Secondary | ICD-10-CM | POA: Diagnosis not present

## 2023-11-09 DIAGNOSIS — R109 Unspecified abdominal pain: Secondary | ICD-10-CM | POA: Diagnosis present

## 2023-11-09 DIAGNOSIS — R0602 Shortness of breath: Secondary | ICD-10-CM | POA: Insufficient documentation

## 2023-11-09 DIAGNOSIS — Z3689 Encounter for other specified antenatal screening: Secondary | ICD-10-CM

## 2023-11-09 DIAGNOSIS — S3991XA Unspecified injury of abdomen, initial encounter: Secondary | ICD-10-CM | POA: Diagnosis not present

## 2023-11-09 DIAGNOSIS — Z3403 Encounter for supervision of normal first pregnancy, third trimester: Secondary | ICD-10-CM

## 2023-11-09 DIAGNOSIS — O2693 Pregnancy related conditions, unspecified, third trimester: Secondary | ICD-10-CM | POA: Insufficient documentation

## 2023-11-09 DIAGNOSIS — O26899 Other specified pregnancy related conditions, unspecified trimester: Secondary | ICD-10-CM

## 2023-11-09 LAB — URINALYSIS, ROUTINE W REFLEX MICROSCOPIC
Bilirubin Urine: NEGATIVE
Glucose, UA: NEGATIVE mg/dL
Hgb urine dipstick: NEGATIVE
Ketones, ur: NEGATIVE mg/dL
Leukocytes,Ua: NEGATIVE
Nitrite: NEGATIVE
Protein, ur: NEGATIVE mg/dL
Specific Gravity, Urine: 1.002 — ABNORMAL LOW (ref 1.005–1.030)
pH: 6 (ref 5.0–8.0)

## 2023-11-09 NOTE — Progress Notes (Signed)
Written and verbal d/c instructions given and understanding voiced. 
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
## 2023-11-09 NOTE — MAU Note (Signed)
PT given clicker to mark FM as she told CNM she felt baby move "alittle"

## 2023-11-21 ENCOUNTER — Encounter: Payer: Self-pay | Admitting: Obstetrics and Gynecology

## 2023-11-21 ENCOUNTER — Telehealth: Payer: Self-pay | Admitting: Licensed Clinical Social Worker

## 2023-11-21 ENCOUNTER — Ambulatory Visit (INDEPENDENT_AMBULATORY_CARE_PROVIDER_SITE_OTHER): Payer: 59 | Admitting: Obstetrics and Gynecology

## 2023-11-21 VITALS — BP 103/63 | HR 74 | Wt 221.0 lb

## 2023-11-21 DIAGNOSIS — D649 Anemia, unspecified: Secondary | ICD-10-CM

## 2023-11-21 DIAGNOSIS — Z3403 Encounter for supervision of normal first pregnancy, third trimester: Secondary | ICD-10-CM

## 2023-11-21 DIAGNOSIS — O99013 Anemia complicating pregnancy, third trimester: Secondary | ICD-10-CM

## 2023-11-21 DIAGNOSIS — Z3A34 34 weeks gestation of pregnancy: Secondary | ICD-10-CM

## 2023-11-21 NOTE — Telephone Encounter (Signed)
[facility] contacted patient on this date to reschedule [facility] visit on 11/24/2023. A VM was left.

## 2023-11-21 NOTE — Progress Notes (Signed)
   PRENATAL VISIT NOTE  Subjective:  Susan Henderson is a 17 y.o. G1P0 at [redacted]w[redacted]d being seen today for ongoing prenatal care.  She is currently monitored for the following issues for this low-risk pregnancy and has Encounter for supervision of normal first pregnancy in third trimester; Chlamydia trachomatis infection in pregnancy; Supervision of normal first teen pregnancy; Anemia affecting pregnancy; and Chlamydia infection affecting pregnancy in second trimester on their problem list.  Patient reports no complaints.  Contractions: Irregular. Vag. Bleeding: None.  Movement: Present. Denies leaking of fluid.   The following portions of the patient's history were reviewed and updated as appropriate: allergies, current medications, past family history, past medical history, past social history, past surgical history and problem list.   Objective:   Vitals:   11/21/23 1022  BP: 103/63  Pulse: 74  Weight: 221 lb (100.2 kg)    Fetal Status: Fetal Heart Rate (bpm): 130 Fundal Height: 34 cm Movement: Present     General:  Alert, oriented and cooperative. Patient is in no acute distress.  Skin: Skin is warm and dry. No rash noted.   Cardiovascular: Normal heart rate noted  Respiratory: Normal respiratory effort, no problems with respiration noted  Abdomen: Soft, gravid, appropriate for gestational age.  Pain/Pressure: Absent     Pelvic: Cervical exam deferred        Extremities: Normal range of motion.  Edema: None  Mental Status: Normal mood and affect. Normal behavior. Normal judgment and thought content.   Assessment and Plan:  Pregnancy: G1P0 at [redacted]w[redacted]d 1. Encounter for supervision of normal first pregnancy in third trimester (Primary) Patient is doing well without complaints Cultures next visit  2. Anemia affecting pregnancy in third trimester Stable at 28 week labs  Preterm labor symptoms and general obstetric precautions including but not limited to vaginal bleeding, contractions,  leaking of fluid and fetal movement were reviewed in detail with the patient. Please refer to After Visit Summary for other counseling recommendations.   Return in about 2 weeks (around 12/05/2023) for in person, ROB, Low risk.  Future Appointments  Date Time Provider Department Center  11/24/2023  3:00 PM Russ Halo, [place] [facility] None  12/05/2023  9:55 AM Sue Lush, FNP [facility] None  12/12/2023  9:55 AM Lennart Pall, MD [facility] None  12/19/2023  9:55 AM Allie Bossier, MD [facility] None  12/26/2023  9:55 AM Carlynn Herald, CNM [facility] None  01/02/2024  9:55 AM Lennart Pall, MD [facility] None    Catalina Antigua, MD

## 2023-11-24 ENCOUNTER — Ambulatory Visit: Payer: Self-pay | Admitting: Licensed Clinical Social Worker

## 2023-11-24 ENCOUNTER — Telehealth: Payer: Self-pay | Admitting: Licensed Clinical Social Worker

## 2023-11-24 DIAGNOSIS — Z91199 Patient's noncompliance with other medical treatment and regimen due to unspecified reason: Secondary | ICD-10-CM

## 2023-11-24 NOTE — Telephone Encounter (Signed)
[facility] contacted patient on this date to reschedule [facility] visit on 11/24/2023. A VM was left.

## 2023-12-01 NOTE — BH Specialist Note (Signed)
Patient no showed for today's visit. Virtual link sent to patient's phone and a phone call was provided. A VM was left.

## 2023-12-03 NOTE — L&D Delivery Note (Addendum)
 OB/GYN Faculty Practice Delivery Note  [name] is a 18 y.o. G1P0 s/p NSVD at [redacted]w[redacted]d. She was admitted for SROM and contractions.  ROM: 14h 35m with moderate meconium fluid GBS Status: neg Maximum Maternal Temperature: 98.4  Labor Progress: SROM, pitocin  augmentation  Delivery Date/Time: 01/08/2024 Delivery: Called to room and patient was complete and pushing. Head delivered LOA. No nuchal cord present. Shoulder and body delivered in usual fashion. Infant with spontaneous cry, placed on mother's abdomen, dried and stimulated. Cord clamped x 2 after 1-minute delay, and cut by mother of the patient. Cord blood drawn. Placenta delivered spontaneously with gentle cord traction. Fundus firm with massage and Pitocin . Labia, perineum, vagina, and cervix inspected. Second degree vaginal laceration with repair, and right labial extension, repaired.  Placenta: intact, to L&D Complications: none Lacerations: 2nd degree, right labial extension EBL: 219 Analgesia: epidural  Postpartum Planning [ x] message to sent to schedule follow-up  TDAP and Flu declined in clinic  Infant: Boy Sherra Waddell Harps 8/9  weight pending  Mitzie JINNY Molly, Student-MidWife  01/08/2024 3:42 PM

## 2023-12-04 NOTE — Progress Notes (Signed)
   PRENATAL VISIT NOTE  Subjective:  Susan Henderson is a 18 y.o. G1P0 at [redacted]w[redacted]d being seen today for ongoing prenatal care.  She is currently monitored for the following issues for this low-risk pregnancy and has Encounter for supervision of normal first pregnancy in third trimester; Chlamydia trachomatis infection in pregnancy; Supervision of normal first teen pregnancy; Anemia affecting pregnancy; and Chlamydia infection affecting pregnancy in second trimester on their problem list.  Patient reports no complaints.Denies leaking of fluid, vaginal bleeding, contractions. Endorses fetal movement.  The following portions of the patient's history were reviewed and updated as appropriate: allergies, current medications, past family history, past medical history, past social history, past surgical history and problem list.   Objective:   Vitals:   12/05/23 1016  BP: 132/75  Pulse: 72  Weight: 227 lb (103 kg)    Fetal Status:   Fundal Height: 36 cm Movement: Present     General:  Alert, oriented and cooperative. Patient is in no acute distress.  Skin: Skin is warm and dry. No rash noted.   Cardiovascular: Normal heart rate noted  Respiratory: Normal respiratory effort, no problems with respiration noted  Abdomen: Soft, gravid, appropriate for gestational age.  Pain/Pressure: Absent     Pelvic: Cervical exam deferred        Extremities: Normal range of motion.  Edema: Trace  Mental Status: Normal mood and affect. Normal behavior. Normal judgment and thought content.   Assessment and Plan:  Pregnancy: G1P0 at [redacted]w[redacted]d 1. Encounter for supervision of normal first pregnancy in third trimester (Primary) -Patient stable and feeling well. Feeling vigorous movement. FHR and FH wnl.    2. [redacted] weeks gestation of pregnancy -Anticipatory guidance about next visits/weeks of pregnancy given.  - Culture, beta strep (group b only)   Preterm labor symptoms and general obstetric precautions including but  not limited to vaginal bleeding, contractions, leaking of fluid and fetal movement were reviewed in detail with the patient.   Please refer to After Visit Summary for other counseling recommendations.   Return in about 1 week (around 12/12/2023) for LOB.  Future Appointments  Date Time Provider Department Center  12/12/2023  9:55 AM Erik Kieth BROCKS, MD [facility] None  12/19/2023  9:55 AM Starla Harland BROCKS, MD [facility] None  12/26/2023  9:55 AM Emilio Delilah HERO, CNM [facility] None  01/02/2024  9:55 AM Erik Kieth BROCKS, MD [facility] None    Jorene FORBES Moats, PA-C

## 2023-12-05 ENCOUNTER — Ambulatory Visit (INDEPENDENT_AMBULATORY_CARE_PROVIDER_SITE_OTHER): Payer: 59 | Admitting: Obstetrics and Gynecology

## 2023-12-05 ENCOUNTER — Encounter: Payer: Self-pay | Admitting: Obstetrics and Gynecology

## 2023-12-05 ENCOUNTER — Encounter: Payer: Self-pay | Admitting: Physician Assistant

## 2023-12-05 ENCOUNTER — Other Ambulatory Visit (HOSPITAL_COMMUNITY)
Admission: RE | Admit: 2023-12-05 | Discharge: 2023-12-05 | Disposition: A | Discharge status: Home / Self Care | Payer: 59 | Source: Ambulatory Visit | Attending: Obstetrics and Gynecology | Admitting: Obstetrics and Gynecology

## 2023-12-05 VITALS — BP 132/75 | HR 72 | Wt 227.0 lb

## 2023-12-05 DIAGNOSIS — Z3A36 36 weeks gestation of pregnancy: Secondary | ICD-10-CM

## 2023-12-05 DIAGNOSIS — Z3403 Encounter for supervision of normal first pregnancy, third trimester: Secondary | ICD-10-CM

## 2023-12-05 DIAGNOSIS — Z3493 Encounter for supervision of normal pregnancy, unspecified, third trimester: Secondary | ICD-10-CM | POA: Diagnosis present

## 2023-12-08 LAB — CERVICOVAGINAL ANCILLARY ONLY
Bacterial Vaginitis (gardnerella): NEGATIVE
Candida Glabrata: NEGATIVE
Candida Vaginitis: NEGATIVE
Chlamydia: NEGATIVE
Comment: NEGATIVE
Comment: NEGATIVE
Comment: NEGATIVE
Comment: NEGATIVE
Comment: NEGATIVE
Comment: NORMAL
Neisseria Gonorrhea: NEGATIVE
Trichomonas: NEGATIVE

## 2023-12-09 LAB — CULTURE, BETA STREP (GROUP B ONLY): Strep Gp B Culture: NEGATIVE

## 2023-12-10 ENCOUNTER — Telehealth: Payer: Self-pay

## 2023-12-10 NOTE — Telephone Encounter (Signed)
 Followed up with pt, after hours nurse advised that pt had a headache yesterday, advised pt to take Tylenol. Pt states that headache is relived today, advised pt to take BP, pt stated that she will take it later and send reading in [place]

## 2023-12-12 ENCOUNTER — Ambulatory Visit (INDEPENDENT_AMBULATORY_CARE_PROVIDER_SITE_OTHER): Payer: 59 | Admitting: Obstetrics and Gynecology

## 2023-12-12 VITALS — BP 123/76 | HR 75 | Wt 227.0 lb

## 2023-12-12 DIAGNOSIS — D649 Anemia, unspecified: Secondary | ICD-10-CM

## 2023-12-12 DIAGNOSIS — O98313 Other infections with a predominantly sexual mode of transmission complicating pregnancy, third trimester: Secondary | ICD-10-CM

## 2023-12-12 DIAGNOSIS — A568 Sexually transmitted chlamydial infection of other sites: Secondary | ICD-10-CM

## 2023-12-12 DIAGNOSIS — O99013 Anemia complicating pregnancy, third trimester: Secondary | ICD-10-CM

## 2023-12-12 DIAGNOSIS — Z3403 Encounter for supervision of normal first pregnancy, third trimester: Secondary | ICD-10-CM

## 2023-12-12 DIAGNOSIS — Z3A37 37 weeks gestation of pregnancy: Secondary | ICD-10-CM

## 2023-12-12 NOTE — Progress Notes (Signed)
   PRENATAL VISIT NOTE  Subjective:  Susan Henderson is a 18 y.o. G1P0 at [redacted]w[redacted]d being seen today for ongoing prenatal care.  She is currently monitored for the following issues for this low-risk pregnancy and has Encounter for supervision of normal first pregnancy in third trimester; Chlamydia trachomatis infection in pregnancy; Supervision of normal first teen pregnancy; and Anemia affecting pregnancy on their problem list.  Patient reports no concerns.  Contractions: Irregular. Vag. Bleeding: None.  Movement: Present. Denies leaking of fluid, vaginal bleeding, contractions. Endorses fetal movement.   The following portions of the patient's history were reviewed and updated as appropriate: allergies, current medications, past family history, past medical history, past social history, past surgical history and problem list.   Objective:   Vitals:   12/12/23 1010  BP: 123/76  Pulse: 75  Weight: 227 lb (103 kg)    Fetal Status: Fetal Heart Rate (bpm): 140 Fundal Height: 37 cm Movement: Present     General:  Alert, oriented and cooperative. Patient is in no acute distress.  Skin: Skin is warm and dry. No rash noted.   Cardiovascular: Normal heart rate noted  Respiratory: Normal respiratory effort, no problems with respiration noted  Abdomen: Soft, gravid, appropriate for gestational age.  Pain/Pressure: Present      Assessment and Plan:  Pregnancy: G1P0 at [redacted]w[redacted]d 1. Encounter for supervision of normal first pregnancy in third trimester (Primary) 2. [redacted] weeks gestation of pregnancy Patient stable and feeling well, feeling vigorous fetal movement.  FHR and FH wnl. GBS neg Politely declines cervical check today Discussed signs of labor with summary in AVS  3. Chlamydia trachomatis infection in mother during pregnancy, antepartum TOC neg & negative again at 36 weeks  4. Anemia affecting pregnancy in third trimester Resolved, /3\ Hgb 11.1  Term labor symptoms and general obstetric  precautions including but not limited to vaginal bleeding, contractions, leaking of fluid and fetal movement were reviewed in detail with the patient.  Please refer to After Visit Summary for other counseling recommendations.   Return in about 1 week (around 12/19/2023) for LOB.  Future Appointments  Date Time Provider Department Center  12/19/2023  9:55 AM Starla Harland BROCKS, MD [facility] None  12/26/2023  9:55 AM Emilio Delilah HERO, CNM [facility] None  01/02/2024  9:55 AM Erik Kieth BROCKS, MD [facility] None    Jorene FORBES Moats, PA-C

## 2023-12-19 ENCOUNTER — Ambulatory Visit (INDEPENDENT_AMBULATORY_CARE_PROVIDER_SITE_OTHER): Payer: 59 | Admitting: Obstetrics & Gynecology

## 2023-12-19 VITALS — BP 116/69 | HR 70 | Wt 228.9 lb

## 2023-12-19 DIAGNOSIS — O99210 Obesity complicating pregnancy, unspecified trimester: Secondary | ICD-10-CM

## 2023-12-19 DIAGNOSIS — O2603 Excessive weight gain in pregnancy, third trimester: Secondary | ICD-10-CM | POA: Insufficient documentation

## 2023-12-19 DIAGNOSIS — O99013 Anemia complicating pregnancy, third trimester: Secondary | ICD-10-CM

## 2023-12-19 DIAGNOSIS — Z3403 Encounter for supervision of normal first pregnancy, third trimester: Secondary | ICD-10-CM

## 2023-12-19 DIAGNOSIS — Z3A38 38 weeks gestation of pregnancy: Secondary | ICD-10-CM

## 2023-12-19 DIAGNOSIS — A568 Sexually transmitted chlamydial infection of other sites: Secondary | ICD-10-CM

## 2023-12-19 DIAGNOSIS — O98319 Other infections with a predominantly sexual mode of transmission complicating pregnancy, unspecified trimester: Secondary | ICD-10-CM

## 2023-12-19 NOTE — Progress Notes (Signed)
Pt. Presents for rob. Pt. States that she fell on 1/12 and her backside still hurts. Pt. Has no other questions or concerns at this time.

## 2023-12-19 NOTE — Progress Notes (Signed)
   PRENATAL VISIT NOTE  Subjective:  Susan Henderson is a 18 y.o. G1P0 at [redacted]w[redacted]d being seen today for ongoing prenatal care.  She is currently monitored for the following issues for this low-risk pregnancy and has Encounter for supervision of normal first pregnancy in third trimester; Chlamydia trachomatis infection in pregnancy; Supervision of normal first teen pregnancy; Anemia affecting pregnancy; Excessive weight gain during pregnancy in third trimester; and Obesity in pregnancy on their problem list.  Patient reports  she fell on her butt last week and still has discomfort at her sacrum .  Contractions: Irritability. Vag. Bleeding: None.  Movement: Present. Denies leaking of fluid.   The following portions of the patient's history were reviewed and updated as appropriate: allergies, current medications, past family history, past medical history, past social history, past surgical history and problem list.   Objective:   Vitals:   12/19/23 0958  BP: 116/69  Pulse: 70  Weight: 228 lb 14.4 oz (103.8 kg)    Fetal Status: Fetal Heart Rate (bpm): 144 Fundal Height: 39 cm Movement: Present  Presentation: Vertex  General:  Alert, oriented and cooperative. Patient is in no acute distress.  Skin: Skin is warm and dry. No rash noted.   Cardiovascular: Normal heart rate noted  Respiratory: Normal respiratory effort, no problems with respiration noted  Abdomen: Soft, gravid, appropriate for gestational age.  Pain/Pressure: Present     Pelvic: Declined by patient         Extremities: Normal range of motion.  Edema: None  Mental Status: Normal mood and affect. Normal behavior. Normal judgment and thought content.   Assessment and Plan:  Pregnancy: G1P0 at [redacted]w[redacted]d 1. Encounter for supervision of normal first pregnancy in third trimester (Primary)   2. Chlamydia trachomatis infection in mother during pregnancy, antepartum - TOC and 36 week labs normal  3. Supervision of normal first teen  pregnancy in third trimester   4. Anemia affecting pregnancy in third trimester   5. [redacted] weeks gestation of pregnancy   6. Obesity in pregnancy   7. Excessive weight gain during pregnancy in third trimester   Term labor symptoms and general obstetric precautions including but not limited to vaginal bleeding, contractions, leaking of fluid and fetal movement were reviewed in detail with the patient. Please refer to After Visit Summary for other counseling recommendations.   Return in about 1 week (around 12/26/2023).  Future Appointments  Date Time Provider Department Center  12/26/2023  9:55 AM Carlynn Herald, CNM [facility] None  01/02/2024  9:55 AM Lennart Pall, MD [facility] None    Allie Bossier, MD

## 2023-12-26 ENCOUNTER — Ambulatory Visit (INDEPENDENT_AMBULATORY_CARE_PROVIDER_SITE_OTHER): Payer: 59 | Admitting: Certified Nurse Midwife

## 2023-12-26 VITALS — BP 134/76 | HR 72 | Wt 245.1 lb

## 2023-12-26 DIAGNOSIS — O0993 Supervision of high risk pregnancy, unspecified, third trimester: Secondary | ICD-10-CM

## 2023-12-26 DIAGNOSIS — Z3A39 39 weeks gestation of pregnancy: Secondary | ICD-10-CM

## 2023-12-26 NOTE — Progress Notes (Signed)
   PRENATAL VISIT NOTE  Subjective:  Susan Henderson is a 18 y.o. G1P0 at [redacted]w[redacted]d being seen today for ongoing prenatal care.  She is currently monitored for the following issues for this high-risk pregnancy and has Encounter for supervision of normal first pregnancy in third trimester; Chlamydia trachomatis infection in pregnancy; Supervision of normal first teen pregnancy; Anemia affecting pregnancy; Excessive weight gain during pregnancy in third trimester; and Obesity in pregnancy on their problem list.  Patient reports  lower abdominal cramping .  Contractions: Irritability. Vag. Bleeding: None.  Movement: Present. Denies leaking of fluid.   The following portions of the patient's history were reviewed and updated as appropriate: allergies, current medications, past family history, past medical history, past social history, past surgical history and problem list.   Objective:   Vitals:   12/26/23 1003  BP: 134/76  Pulse: 72  Weight: 111.2 kg    Fetal Status:   Fundal Height: 41 cm Movement: Present     General:  Alert, oriented and cooperative. Patient is in no acute distress.  Skin: Skin is warm and dry. No rash noted.   Cardiovascular: Normal heart rate noted  Respiratory: Normal respiratory effort, no problems with respiration noted  Abdomen: Soft, gravid, appropriate for gestational age.  Pain/Pressure: Present     Pelvic: Cervical exam performed in the presence of a chaperone Dilation: Closed Effacement (%): Thick    Extremities: Normal range of motion.  Edema: Trace (Hands and feet)  Mental Status: Normal mood and affect. Normal behavior. Normal judgment and thought content.   Assessment and Plan:  Pregnancy: G1P0 at [redacted]w[redacted]d 1. Supervision of high risk pregnancy in third trimester (Primary) 2. [redacted] weeks gestation of pregnancy -CE today, per patient request -Reviewed expectant management -Reviewed labor readiness with patient including the [employer], evening primrose oil,  and raspberry leaf tea.    Term labor symptoms and general obstetric precautions including but not limited to vaginal bleeding, contractions, leaking of fluid and fetal movement were reviewed in detail with the patient. Please refer to After Visit Summary for other counseling recommendations.   Return in about 1 week (around 01/02/2024) for LOB.  Future Appointments  Date Time Provider Department Center  01/02/2024  9:55 AM Lennart Pall, MD [facility] None    [address] [place], [place]

## 2024-01-02 ENCOUNTER — Ambulatory Visit (INDEPENDENT_AMBULATORY_CARE_PROVIDER_SITE_OTHER): Payer: 59 | Admitting: Obstetrics and Gynecology

## 2024-01-02 VITALS — BP 128/69 | HR 66 | Wt 248.8 lb

## 2024-01-02 DIAGNOSIS — O98319 Other infections with a predominantly sexual mode of transmission complicating pregnancy, unspecified trimester: Secondary | ICD-10-CM

## 2024-01-02 DIAGNOSIS — A568 Sexually transmitted chlamydial infection of other sites: Secondary | ICD-10-CM

## 2024-01-02 DIAGNOSIS — Z3A40 40 weeks gestation of pregnancy: Secondary | ICD-10-CM

## 2024-01-02 DIAGNOSIS — O99013 Anemia complicating pregnancy, third trimester: Secondary | ICD-10-CM

## 2024-01-02 DIAGNOSIS — Z3403 Encounter for supervision of normal first pregnancy, third trimester: Secondary | ICD-10-CM

## 2024-01-02 NOTE — Progress Notes (Signed)
   PRENATAL VISIT NOTE  Subjective:  Susan Henderson is a 18 y.o. G1P0 at [redacted]w[redacted]d being seen today for ongoing prenatal care.  She is currently monitored for the following issues for this low-risk pregnancy and has Encounter for supervision of normal first pregnancy in third trimester; Chlamydia trachomatis infection in pregnancy; Anemia affecting pregnancy; and Excessive weight gain during pregnancy in third trimester on their problem list.  Patient reports no complaints.  Contractions: Not present. Vag. Bleeding: None.  Movement: Present. Denies leaking of fluid.   The following portions of the patient's history were reviewed and updated as appropriate: allergies, current medications, past family history, past medical history, past social history, past surgical history and problem list.   Objective:   Vitals:   01/02/24 1017  BP: 128/69  Pulse: 66  Weight: 248 lb 12.8 oz (112.9 kg)    Fetal Status: Fetal Heart Rate (bpm): 125   Movement: Present     General:  Alert, oriented and cooperative. Patient is in no acute distress.  Skin: Skin is warm and dry. No rash noted.   Cardiovascular: Normal heart rate noted  Respiratory: Normal respiratory effort, no problems with respiration noted  Abdomen: Soft, gravid, appropriate for gestational age.  Pain/Pressure: Absent      Assessment and Plan:  Pregnancy: G1P0 at [redacted]w[redacted]d 1. Encounter for supervision of normal first pregnancy in third trimester (Primary) 2. [redacted] weeks gestation of pregnancy NST today reactive & reassuring - 125bpm, moderate variability, +accels, no decels Post term IOL discussed and ordered for 2/6  3. Anemia affecting pregnancy in third trimester Resolved, /3\ Hgb 11.1  4. Chlamydia trachomatis infection in mother during pregnancy, antepartum Negative at 36 weeks  Term labor symptoms and general obstetric precautions including but not limited to vaginal bleeding, contractions, leaking of fluid and fetal movement were  reviewed in detail with the patient. Please refer to After Visit Summary for other counseling recommendations.   Return in about 6 days (around 01/08/2024) for for induction of labor.  Lennart Pall, MD

## 2024-01-06 ENCOUNTER — Encounter (HOSPITAL_COMMUNITY): Payer: Self-pay

## 2024-01-06 ENCOUNTER — Other Ambulatory Visit: Payer: Self-pay | Admitting: Advanced Practice Midwife

## 2024-01-06 ENCOUNTER — Telehealth (HOSPITAL_COMMUNITY): Payer: Self-pay | Admitting: *Deleted

## 2024-01-06 NOTE — Telephone Encounter (Signed)
 Preadmission screen

## 2024-01-07 ENCOUNTER — Encounter (HOSPITAL_COMMUNITY): Payer: Self-pay | Admitting: Obstetrics and Gynecology

## 2024-01-07 ENCOUNTER — Telehealth (HOSPITAL_COMMUNITY): Payer: Self-pay | Admitting: *Deleted

## 2024-01-07 ENCOUNTER — Other Ambulatory Visit: Payer: Self-pay

## 2024-01-07 ENCOUNTER — Inpatient Hospital Stay (HOSPITAL_COMMUNITY)
Admission: AD | Admit: 2024-01-07 | Discharge: 2024-01-10 | DRG: 807 | Disposition: A | Discharge status: Home / Self Care | Payer: 59 | Attending: Obstetrics & Gynecology | Admitting: Obstetrics & Gynecology

## 2024-01-07 DIAGNOSIS — Z833 Family history of diabetes mellitus: Secondary | ICD-10-CM

## 2024-01-07 DIAGNOSIS — Z8249 Family history of ischemic heart disease and other diseases of the circulatory system: Secondary | ICD-10-CM

## 2024-01-07 DIAGNOSIS — O2603 Excessive weight gain in pregnancy, third trimester: Secondary | ICD-10-CM | POA: Diagnosis present

## 2024-01-07 DIAGNOSIS — O99214 Obesity complicating childbirth: Secondary | ICD-10-CM | POA: Diagnosis present

## 2024-01-07 DIAGNOSIS — O99019 Anemia complicating pregnancy, unspecified trimester: Secondary | ICD-10-CM | POA: Diagnosis present

## 2024-01-07 DIAGNOSIS — O9902 Anemia complicating childbirth: Secondary | ICD-10-CM | POA: Diagnosis present

## 2024-01-07 DIAGNOSIS — O98319 Other infections with a predominantly sexual mode of transmission complicating pregnancy, unspecified trimester: Secondary | ICD-10-CM | POA: Diagnosis present

## 2024-01-07 DIAGNOSIS — O480 Post-term pregnancy: Secondary | ICD-10-CM | POA: Diagnosis present

## 2024-01-07 DIAGNOSIS — A568 Sexually transmitted chlamydial infection of other sites: Secondary | ICD-10-CM | POA: Diagnosis present

## 2024-01-07 DIAGNOSIS — O26893 Other specified pregnancy related conditions, third trimester: Secondary | ICD-10-CM | POA: Diagnosis present

## 2024-01-07 DIAGNOSIS — Z3A41 41 weeks gestation of pregnancy: Secondary | ICD-10-CM

## 2024-01-07 DIAGNOSIS — Z3403 Encounter for supervision of normal first pregnancy, third trimester: Principal | ICD-10-CM

## 2024-01-07 LAB — CBC
HCT: 37.7 % (ref 36.0–46.0)
Hemoglobin: 12.3 g/dL (ref 12.0–15.0)
MCH: 28 pg (ref 26.0–34.0)
MCHC: 32.6 g/dL (ref 30.0–36.0)
MCV: 85.9 fL (ref 80.0–100.0)
Platelets: 236 10*3/uL (ref 150–400)
RBC: 4.39 MIL/uL (ref 3.87–5.11)
RDW: 14.1 % (ref 11.5–15.5)
WBC: 9.5 10*3/uL (ref 4.0–10.5)
nRBC: 0 % (ref 0.0–0.2)

## 2024-01-07 LAB — TYPE AND SCREEN
ABO/RH(D): O POS
Antibody Screen: NEGATIVE

## 2024-01-07 LAB — POCT FERN TEST: POCT Fern Test: NEGATIVE

## 2024-01-07 MED ORDER — OXYTOCIN BOLUS FROM INFUSION
333.0000 mL | Freq: Once | INTRAVENOUS | Status: AC
  Administered 2024-01-08: 333 mL via INTRAVENOUS

## 2024-01-07 MED ORDER — SODIUM CHLORIDE 0.9% FLUSH: 3.0000 mL | Freq: Two times a day (BID) | INTRAVENOUS | Status: DC

## 2024-01-07 MED ORDER — MISOPROSTOL 50MCG HALF TABLET: 50.0000 ug | ORAL_TABLET | Freq: Once | ORAL | Status: DC

## 2024-01-07 MED ORDER — LACTATED RINGERS IV SOLN
500.0000 mL | INTRAVENOUS | Status: DC | PRN
  Administered 2024-01-08: 1000 mL via INTRAVENOUS

## 2024-01-07 MED ORDER — FENTANYL CITRATE (PF) 100 MCG/2ML IJ SOLN
50.0000 ug | INTRAMUSCULAR | Status: DC | PRN
  Administered 2024-01-07: 50 ug via INTRAVENOUS
  Administered 2024-01-08: 100 ug via INTRAVENOUS
  Filled 2024-01-07 (×2): qty 2

## 2024-01-07 MED ORDER — ACETAMINOPHEN 325 MG PO TABS: 650.0000 mg | ORAL_TABLET | ORAL | Status: DC | PRN

## 2024-01-07 MED ORDER — OXYTOCIN-SODIUM CHLORIDE 30-0.9 UT/500ML-% IV SOLN: 2.5000 [IU]/h | INTRAVENOUS | Status: DC

## 2024-01-07 MED ORDER — SODIUM CHLORIDE 0.9% FLUSH: 3.0000 mL | INTRAVENOUS | Status: DC | PRN

## 2024-01-07 MED ORDER — MISOPROSTOL 25 MCG QUARTER TABLET: 25.0000 ug | ORAL_TABLET | Freq: Once | ORAL | Status: DC

## 2024-01-07 MED ORDER — SODIUM CHLORIDE 0.9 % IV SOLN: 250.0000 mL | INTRAVENOUS | Status: DC | PRN

## 2024-01-07 MED ORDER — LIDOCAINE HCL (PF) 1 % IJ SOLN: 30.0000 mL | INTRAMUSCULAR | Status: DC | PRN

## 2024-01-07 MED ORDER — TERBUTALINE SULFATE 1 MG/ML IJ SOLN: 0.2500 mg | Freq: Once | INTRAMUSCULAR | Status: DC | PRN

## 2024-01-07 MED ORDER — ONDANSETRON HCL 4 MG/2ML IJ SOLN
4.0000 mg | Freq: Four times a day (QID) | INTRAMUSCULAR | Status: DC | PRN
  Administered 2024-01-08: 4 mg via INTRAVENOUS
  Filled 2024-01-07: qty 2

## 2024-01-07 MED ORDER — SOD CITRATE-CITRIC ACID 500-334 MG/5ML PO SOLN: 30.0000 mL | ORAL | Status: DC | PRN

## 2024-01-07 NOTE — MAU Note (Signed)
 Susan Henderson is a 18 y.o. at [redacted]w[redacted]d here in MAU reporting: had just used the restroom, had a really bad contraction and then a lot of fluid came out.  Doesn't think she has had any since. No bleeding.  Contractions are 4-46min.  Onset of complaint: been all day, consistent in the last hour. ? Fluid at 1500 Pain score: severe Vitals:   01/07/24 1752  BP: 129/75  Pulse: 63  Resp: 18  Temp: 98.1 F (36.7 C)  SpO2: 98%     FHT:124 Lab orders placed from triage:  fern

## 2024-01-07 NOTE — Telephone Encounter (Signed)
 Preadmission screen

## 2024-01-07 NOTE — MAU Provider Note (Signed)
 None      S: Ms. Susan Henderson is a 18 y.o. G1P0 at [redacted]w[redacted]d  who presents to MAU today complaining contractions q 2-5 minutes since 1100. She denies vaginal bleeding. She endorses LOF. She reports normal fetal movement.    O: BP 129/75 (BP Location: Right Arm)   Pulse 63   Temp 98.1 F (36.7 C) (Oral)   Resp 18   Ht 5' 8 (1.727 m)   Wt 110 kg   LMP 03/26/2023 (Exact Date)   SpO2 98%   BMI 36.86 kg/m  GENERAL: Well-developed, well-nourished female in no acute distress.  HEAD: Normocephalic, atraumatic.  CHEST: Normal effort of breathing, regular heart rate ABDOMEN: Soft, nontender, gravid  Cervical exam:  Dilation: 1.5 Effacement (%): 70 Cervical Position: Posterior Exam by:: S. Warren-Hill,CNM   Fetal Monitoring: Baseline: 120 Variability: moderate Accelerations: 15x15 Decelerations: absent Contractions: 2-5   A: SIUP at [redacted]w[redacted]d  SOL latent labor  P: Admit to L&D  Hand off to L&D team  Regino Camie LABOR, CNM 01/07/2024 7:13 PM

## 2024-01-07 NOTE — Progress Notes (Signed)
 LABOR PROGRESS NOTE  Patient Name: Susan Henderson, female   DOB: 09/19/2006, 18 y.o.  MRN: [id number]  Patient continuing to uncomfortably contract 1-2 minutes. Amenable to check. Requested fentanyl  prior. Cervix 3/70/-1. Cat I. Continue expectant management.  Susan CHRISTELLA Moats, MD

## 2024-01-07 NOTE — H&P (Signed)
 LABOR AND DELIVERY ADMISSION HISTORY AND PHYSICAL NOTE  Susan Henderson is a 18 y.o. female G1P0 with IUP at [redacted]w[redacted]d presenting for regular contractions.   Patient reports the fetal movement as active. Patient reports uterine contraction activity as regular, every 2-5 minutes. Patient reports vaginal bleeding as none. Patient describes fluid per vagina as Clear.   Patient denies headache, vision changes, chest pain, shortness of breath, right upper quadrant pain, or LE edema.  She plans on breast feeding and bottle feeding. Her contraception plan is: no method.  Prenatal History/Complications: PNC at [facility]:  @[redacted]w[redacted]d , CWD, normal anatomy, cephalic presentation, anterior placenta, 35%ile  Pregnancy complications:  Patient Active Problem List   Diagnosis Date Noted   Post term pregnancy at [redacted] weeks gestation 01/07/2024   Excessive weight gain during pregnancy in third trimester 12/19/2023   Anemia affecting pregnancy 09/10/2023   Chlamydia trachomatis infection in pregnancy 08/08/2023   Encounter for supervision of normal first pregnancy in third trimester 08/05/2023    Past Medical History: Past Medical History:  Diagnosis Date   Medical history non-contributory     Past Surgical History: Past Surgical History:  Procedure Laterality Date   ADENOIDECTOMY     NASAL SEPTUM SURGERY     TONSILLECTOMY      Obstetrical History: OB History     Gravida  1   Para      Term      Preterm      AB      Living         SAB      IAB      Ectopic      Multiple      Live Births              Social History: Social History   Socioeconomic History   Marital status: Single    Spouse name: Not on file   Number of children: Not on file   Years of education: Not on file   Highest education level: Not on file  Occupational History   Not on file  Tobacco Use   Smoking status: Never   Smokeless tobacco: Never  Vaping Use   Vaping status: Former  Substance  and Sexual Activity   Alcohol use: Not Currently   Drug use: Not Currently   Sexual activity: Not Currently    Birth control/protection: None  Other Topics Concern   Not on file  Social History Narrative   Not on file   Social Drivers of Health   Financial Resource Strain: Not on file  Food Insecurity: Food Insecurity Present (01/07/2024)   Hunger Vital Sign    Worried About Running Out of Food in the Last Year: Sometimes true    Ran Out of Food in the Last Year: Sometimes true  Transportation Needs: Unmet Transportation Needs (01/07/2024)   PRAPARE - Administrator, Civil Service (Medical): Yes    Lack of Transportation (Non-Medical): No  Physical Activity: Not on file  Stress: Not on file  Social Connections: Unknown (03/31/2022)   Received from [facility]   Social Network    Social Network: Not on file    Family History: Family History  Problem Relation Age of Onset   Cancer Maternal Grandfather    Diabetes Maternal Grandfather    Hypertension Other     Allergies: No Known Allergies  Medications Prior to Admission  Medication Sig Dispense Refill Last Dose/Taking   Prenatal 28-0.8 MG  TABS Take 1 each by mouth daily. 100 tablet 6 Past Week   Prenatal Vit-Fe Fumarate-FA (PRENATAL VITAMINS PO) Take by mouth. (Patient not taking: Reported on 12/05/2023)        Review of Systems  All systems reviewed and negative except as stated in HPI  Physical Exam BP 129/75 (BP Location: Right Arm)   Pulse 63   Temp 98.1 F (36.7 C) (Oral)   Resp 18   Ht 5' 8 (1.727 m)   Wt 110 kg   LMP 03/26/2023 (Exact Date)   SpO2 98%   BMI 36.86 kg/m   Physical Exam Constitutional:      General: She is not in acute distress.    Appearance: She is not ill-appearing.     Comments: Uncomfortable with contractions  Cardiovascular:     Rate and Rhythm: Normal rate and regular rhythm.  Pulmonary:     Effort: Pulmonary effort is normal.  Abdominal:      Comments: Gravid  Musculoskeletal:        General: No swelling.  Skin:    General: Skin is warm and dry.  Neurological:     General: No focal deficit present.  Psychiatric:        Mood and Affect: Mood normal.   Presentation: cephalic by check  Fetal monitoring: Baseline: 125 bpm, Variability: Good {> 6 bpm), Accelerations: Reactive, and Decelerations: Absent Uterine activity: 1-2  Dilation: 3 Effacement (%): 70 Cervical Position: Posterior Station: -1 Exam by:: Nicholaus MD  Prenatal labs: ABO, Rh: --/--/O POS (02/05 1957) Antibody: NEG (02/05 1957) Rubella: 1.07 (09/03 1446) RPR: Non Reactive (11/06 0906)  HBsAg: Negative (09/03 1446)  HIV: Non Reactive (11/06 0906)  GC/Chlamydia:  Neisseria Gonorrhea  Date Value Ref Range Status  12/05/2023 Negative  Final   Chlamydia  Date Value Ref Range Status  12/05/2023 Negative  Final   GBS: Negative/-- (01/03 1222)   Prenatal Transfer Tool  Maternal Diabetes: No Genetic Screening: Normal Maternal Ultrasounds/Referrals: Normal Fetal Ultrasounds or other Referrals:  None Maternal Substance Abuse:  No Significant Maternal Medications:  None Significant Maternal Lab Results: Group B Strep negative  Results for orders placed or performed during the hospital encounter of 01/07/24 (from the past 24 hours)  Fern Test   Collection Time: 01/07/24  6:25 PM  Result Value Ref Range   POCT Fern Test Negative = intact amniotic membranes   Type and screen   Collection Time: 01/07/24  7:57 PM  Result Value Ref Range   ABO/RH(D) O POS    Antibody Screen NEG    Sample Expiration      01/10/2024,2359 Performed at [facility], [address] [place], [place] [address]   CBC   Collection Time: 01/07/24  7:58 PM  Result Value Ref Range   WBC 9.5 4.0 - 10.5 K/uL   RBC 4.39 3.87 - 5.11 MIL/uL   Hemoglobin 12.3 12.0 - 15.0 g/dL   HCT 62.2 63.9 - 53.9 %   MCV 85.9 80.0 - 100.0 fL   MCH 28.0 26.0 - 34.0 pg   MCHC 32.6 30.0 -  36.0 g/dL   RDW 85.8 88.4 - 84.4 %   Platelets 236 150 - 400 K/uL   nRBC 0.0 0.0 - 0.2 %    Assessment: Susan Henderson is a 18 y.o. G1P0 at [redacted]w[redacted]d here for latent labor post-dates  #Labor: Expectant management #Pain: IV pain meds PRN, epidural upon request #FHT: Category I #GBS/ID: Negative #MOF: breast feeding and bottle  feeding #MOC: no method  #hx chlamydia infection: TOC negative  [name], MD [facility], [facility]  01/07/2024, 11:24 PM

## 2024-01-08 ENCOUNTER — Inpatient Hospital Stay (HOSPITAL_COMMUNITY): Admission: RE | Admit: 2024-01-08 | Payer: 59 | Source: Home / Self Care

## 2024-01-08 ENCOUNTER — Inpatient Hospital Stay (HOSPITAL_COMMUNITY): Payer: 59 | Attending: Obstetrics & Gynecology

## 2024-01-08 ENCOUNTER — Inpatient Hospital Stay (HOSPITAL_COMMUNITY): Payer: 59 | Admitting: Anesthesiology

## 2024-01-08 ENCOUNTER — Encounter (HOSPITAL_COMMUNITY): Payer: Self-pay | Admitting: Obstetrics and Gynecology

## 2024-01-08 DIAGNOSIS — Z3A41 41 weeks gestation of pregnancy: Secondary | ICD-10-CM

## 2024-01-08 DIAGNOSIS — O480 Post-term pregnancy: Secondary | ICD-10-CM

## 2024-01-08 LAB — RPR: RPR Ser Ql: NONREACTIVE

## 2024-01-08 MED ORDER — ONDANSETRON HCL 4 MG/2ML IJ SOLN: 4.0000 mg | INTRAMUSCULAR | Status: DC | PRN

## 2024-01-08 MED ORDER — EPHEDRINE 5 MG/ML INJ: 10.0000 mg | INTRAVENOUS | Status: DC | PRN

## 2024-01-08 MED ORDER — TETANUS-DIPHTH-ACELL PERTUSSIS 5-2.5-18.5 LF-MCG/0.5 IM SUSY: 0.5000 mL | PREFILLED_SYRINGE | Freq: Once | INTRAMUSCULAR | Status: DC

## 2024-01-08 MED ORDER — LIDOCAINE-EPINEPHRINE (PF) 2 %-1:200000 IJ SOLN
INTRAMUSCULAR | Status: DC | PRN
  Administered 2024-01-08: 5 mL via EPIDURAL

## 2024-01-08 MED ORDER — FENTANYL-BUPIVACAINE-NACL 0.5-0.125-0.9 MG/250ML-% EP SOLN
12.0000 mL/h | EPIDURAL | Status: DC | PRN
  Administered 2024-01-08: 12 mL/h via EPIDURAL
  Filled 2024-01-08: qty 250

## 2024-01-08 MED ORDER — COCONUT OIL OIL: 1.0000 | TOPICAL_OIL | Status: DC | PRN

## 2024-01-08 MED ORDER — IBUPROFEN 600 MG PO TABS
600.0000 mg | ORAL_TABLET | Freq: Four times a day (QID) | ORAL | Status: DC
  Administered 2024-01-09 – 2024-01-10 (×5): 600 mg via ORAL
  Filled 2024-01-08 (×5): qty 1

## 2024-01-08 MED ORDER — PRENATAL MULTIVITAMIN CH: 1.0000 | ORAL_TABLET | Freq: Every day | ORAL | Status: DC

## 2024-01-08 MED ORDER — ACETAMINOPHEN 325 MG PO TABS: 650.0000 mg | ORAL_TABLET | ORAL | Status: DC | PRN

## 2024-01-08 MED ORDER — ZOLPIDEM TARTRATE 5 MG PO TABS: 5.0000 mg | ORAL_TABLET | Freq: Every evening | ORAL | Status: DC | PRN

## 2024-01-08 MED ORDER — TERBUTALINE SULFATE 1 MG/ML IJ SOLN: 0.2500 mg | Freq: Once | INTRAMUSCULAR | Status: DC | PRN

## 2024-01-08 MED ORDER — PHENYLEPHRINE 80 MCG/ML (10ML) SYRINGE FOR IV PUSH (FOR BLOOD PRESSURE SUPPORT): 80.0000 ug | PREFILLED_SYRINGE | INTRAVENOUS | Status: DC | PRN

## 2024-01-08 MED ORDER — BENZOCAINE-MENTHOL 20-0.5 % EX AERO
1.0000 | INHALATION_SPRAY | CUTANEOUS | Status: DC | PRN
  Filled 2024-01-08: qty 112

## 2024-01-08 MED ORDER — ONDANSETRON HCL 4 MG PO TABS: 4.0000 mg | ORAL_TABLET | ORAL | Status: DC | PRN

## 2024-01-08 MED ORDER — OXYTOCIN-SODIUM CHLORIDE 30-0.9 UT/500ML-% IV SOLN
1.0000 m[IU]/min | INTRAVENOUS | Status: DC
  Administered 2024-01-08: 1 m[IU]/min via INTRAVENOUS
  Filled 2024-01-08: qty 500

## 2024-01-08 MED ORDER — SIMETHICONE 80 MG PO CHEW: 80.0000 mg | CHEWABLE_TABLET | ORAL | Status: DC | PRN

## 2024-01-08 MED ORDER — DIBUCAINE (PERIANAL) 1 % EX OINT: 1.0000 | TOPICAL_OINTMENT | CUTANEOUS | Status: DC | PRN

## 2024-01-08 MED ORDER — LACTATED RINGERS IV SOLN
500.0000 mL | Freq: Once | INTRAVENOUS | Status: AC
Start: 2024-01-08 — End: 2024-01-08
  Administered 2024-01-08: 500 mL via INTRAVENOUS

## 2024-01-08 MED ORDER — DIPHENHYDRAMINE HCL 50 MG/ML IJ SOLN: 12.5000 mg | INTRAMUSCULAR | Status: DC | PRN

## 2024-01-08 MED ORDER — SENNOSIDES-DOCUSATE SODIUM 8.6-50 MG PO TABS
2.0000 | ORAL_TABLET | Freq: Every day | ORAL | Status: DC
  Administered 2024-01-09 – 2024-01-10 (×2): 2 via ORAL
  Filled 2024-01-08 (×2): qty 2

## 2024-01-08 MED ORDER — DIPHENHYDRAMINE HCL 25 MG PO CAPS: 25.0000 mg | ORAL_CAPSULE | Freq: Four times a day (QID) | ORAL | Status: DC | PRN

## 2024-01-08 MED ORDER — WITCH HAZEL-GLYCERIN EX PADS: 1.0000 | MEDICATED_PAD | CUTANEOUS | Status: DC | PRN

## 2024-01-08 NOTE — Anesthesia Postprocedure Evaluation (Signed)
 Anesthesia Post Note  Patient: Insurance Account Manager  Procedure(s) Performed: AN AD HOC LABOR EPIDURAL     Patient location during evaluation: Mother Baby Anesthesia Type: Epidural Level of consciousness: awake and alert Pain management: pain level controlled Vital Signs Assessment: post-procedure vital signs reviewed and stable Respiratory status: spontaneous breathing, nonlabored ventilation and respiratory function stable Cardiovascular status: stable Postop Assessment: no headache, no backache and epidural receding Anesthetic complications: no   No notable events documented.  Last Vitals:  Vitals:   01/08/24 1658 01/08/24 1801  BP: 125/73 120/76  Pulse: 64 70  Resp: 17 17  Temp: 36.8 C 36.8 C  SpO2: 100% 100%    Last Pain:  Vitals:   01/08/24 1801  TempSrc: Oral  PainSc: 0-No pain   Pain Goal:                   ECHOSTAR

## 2024-01-08 NOTE — Progress Notes (Signed)
 LABOR PROGRESS NOTE  Patient Name: Susan Henderson, female   DOB: [birth year], 18 y.o.  MRN: [id number]  Patient comfortable s/p epidural. Amenable to check. Cervix 5/80/-1. No forebag palpated. Continue expectant management. Cat I.  [name], MD

## 2024-01-08 NOTE — Anesthesia Preprocedure Evaluation (Signed)
 Anesthesia Evaluation  Patient identified by MRN, date of birth, ID band Patient awake    Reviewed: Allergy & Precautions, NPO status , Patient's Chart, lab work & pertinent test results  Airway Mallampati: II  TM Distance: >3 FB Neck ROM: Full    Dental no notable dental hx.    Pulmonary neg pulmonary ROS   Pulmonary exam normal breath sounds clear to auscultation       Cardiovascular negative cardio ROS Normal cardiovascular exam Rhythm:Regular Rate:Normal     Neuro/Psych negative neurological ROS  negative psych ROS   GI/Hepatic negative GI ROS, Neg liver ROS,,,  Endo/Other  negative endocrine ROS  Obese BMI 37  Renal/GU negative Renal ROS  negative genitourinary   Musculoskeletal negative musculoskeletal ROS (+)    Abdominal   Peds  Hematology negative hematology ROS (+)   Anesthesia Other Findings   Reproductive/Obstetrics (+) Pregnancy                             Anesthesia Physical Anesthesia Plan  ASA: 2  Anesthesia Plan: Epidural   Post-op Pain Management:    Induction:   PONV Risk Score and Plan: Treatment may vary due to age or medical condition  Airway Management Planned: Natural Airway  Additional Equipment:   Intra-op Plan:   Post-operative Plan:   Informed Consent: I have reviewed the patients History and Physical, chart, labs and discussed the procedure including the risks, benefits and alternatives for the proposed anesthesia with the patient or authorized representative who has indicated his/her understanding and acceptance.       Plan Discussed with: Anesthesiologist  Anesthesia Plan Comments: (Patient identified. Risks, benefits, options discussed with patient including but not limited to bleeding, infection, nerve damage, paralysis, failed block, incomplete pain control, headache, blood pressure changes, nausea, vomiting, reactions to medication,  itching, and post partum back pain. Confirmed with bedside nurse the patient's most recent platelet count. Confirmed with the patient that they are not taking any anticoagulation, have any bleeding history or any family history of bleeding disorders. Patient expressed understanding and wishes to proceed. All questions were answered. )       Anesthesia Quick Evaluation

## 2024-01-08 NOTE — Discharge Summary (Signed)
 Postpartum Discharge Summary     Patient Name: Susan Henderson DOB: 02-Jun-2006 MRN: [id number]  Date of admission: 01/07/2024 Delivery date:01/08/2024 Delivering provider: ARLYSS MITZIE PARAS Date of discharge: 01/10/2024  Admitting diagnosis: Post term pregnancy at [redacted] weeks gestation [O48.0, Z3A.41] Intrauterine pregnancy: [redacted]w[redacted]d     Secondary diagnosis:  Principal Problem:   Post term pregnancy at [redacted] weeks gestation Active Problems:   Encounter for supervision of normal first pregnancy in third trimester   Chlamydia trachomatis infection in pregnancy   Anemia affecting pregnancy   Excessive weight gain during pregnancy in third trimester   SVD (spontaneous vaginal delivery)  Additional problems: none    Discharge diagnosis: Term Pregnancy Delivered                                              Post partum procedures: none Augmentation: Pitocin  Complications: None  Hospital course: Onset of Labor With Vaginal Delivery      18 y.o. yo G1P1001 at [redacted]w[redacted]d was admitted in Active Labor on 01/07/2024. Labor course was complicated by nothing.  Membrane Rupture Time/Date: 11:50 PM,01/07/2024  Delivery Method:Vaginal, Spontaneous Operative Delivery:N/A Episiotomy: None Lacerations:  2nd degree Patient had a postpartum course complicated by none.  She is ambulating, tolerating a regular diet, passing flatus, and urinating well. Patient is discharged home in stable condition on 01/10/24.  Newborn Data: Birth date:01/08/2024 Birth time:2:43 PM Gender:Female Living status:Living Apgars:8 ,9  Weight:3680 g  Magnesium Sulfate received: No BMZ received: No Rhophylac:N/A MMR:N/A T-DaP: Declined Flu: No RSV Vaccine received: No Transfusion:No  Immunizations received: There is no immunization history for the selected administration types on file for this patient.  Physical exam  Vitals:   01/09/24 0500 01/09/24 1826 01/09/24 2054 01/10/24 0553  BP: (!) 93/50 127/78 137/74 121/68  Pulse:  (!) 56 61 61 (!) 58  Resp: 15 18 18 17   Temp: 97.9 F (36.6 C) 98 F (36.7 C) 98.1 F (36.7 C) 98 F (36.7 C)  TempSrc: Oral Oral Oral Oral  SpO2: 98%  100% 99%  Weight:      Height:       General: alert, cooperative, and no distress Lochia: appropriate Uterine Fundus: firm DVT Evaluation: No evidence of DVT seen on physical exam. Labs: Lab Results  Component Value Date   WBC 9.5 01/07/2024   HGB 12.3 01/07/2024   HCT 37.7 01/07/2024   MCV 85.9 01/07/2024   PLT 236 01/07/2024      Latest Ref Rng & Units 08/22/2023   12:54 AM  CMP  Glucose 70 - 99 mg/dL 95   BUN 6 - 20 mg/dL 8   Creatinine 9.55 - 8.99 mg/dL 9.40   Sodium 864 - 854 mmol/L 135   Potassium 3.5 - 5.1 mmol/L 3.2   Chloride 98 - 111 mmol/L 104   CO2 22 - 32 mmol/L 22   Calcium 8.9 - 10.3 mg/dL 8.5   Total Protein 6.5 - 8.1 g/dL 6.0   Total Bilirubin 0.3 - 1.2 mg/dL 0.6   Alkaline Phos 38 - 126 U/L 43   AST 15 - 41 U/L 12   ALT 0 - 44 U/L 9    Edinburgh Score:    01/08/2024    4:58 PM  Edinburgh Postnatal Depression Scale Screening Tool  I have been able to laugh and see the funny side of things.  0  I have looked forward with enjoyment to things. 0  I have blamed myself unnecessarily when things went wrong. 2  I have been anxious or worried for no good reason. 2  I have felt scared or panicky for no good reason. 1  Things have been getting on top of me. 1  I have been so unhappy that I have had difficulty sleeping. 1  I have felt sad or miserable. 1  I have been so unhappy that I have been crying. 1  The thought of harming myself has occurred to me. 0  Edinburgh Postnatal Depression Scale Total 9   No data recorded  After visit meds:  Allergies as of 01/10/2024   No Known Allergies      Medication List     TAKE these medications    acetaminophen  325 MG tablet Commonly known as: Tylenol  Take 2 tablets (650 mg total) by mouth every 4 (four) hours as needed (mild pain).   ibuprofen  600 MG  tablet Commonly known as: ADVIL  Take 1 tablet (600 mg total) by mouth every 6 (six) hours.   Prenatal 28-0.8 MG Tabs Take 1 each by mouth daily. What changed: Another medication with the same name was removed. Continue taking this medication, and follow the directions you see here.   senna-docusate 8.6-50 MG tablet Commonly known as: Senokot-S Take 2 tablets by mouth at bedtime as needed for mild constipation.         Discharge home in stable condition Infant Feeding: Bottle and Breast Infant Disposition:home with mother Discharge instruction: per After Visit Summary and Postpartum booklet. Activity: Advance as tolerated. Pelvic rest for 6 weeks.  Diet: routine diet Future Appointments: Future Appointments  Date Time Provider Department Center  02/13/2024  8:35 AM Eveline Lynwood MATSU, MD [facility] None   Follow up Visit:  Follow-up Information     [facility] Follow up on 02/13/2024.   Specialty: Obstetrics and Gynecology Why: Follow up for routine postpartum care Contact information: [address] [employer] [place]  [phone]                Please schedule this patient for a In person postpartum visit in 6 weeks with the following provider: Any provider. Additional Postpartum F/U: n/a   Low risk pregnancy complicated by:  nothing Delivery mode:  Vaginal, Spontaneous Anticipated Birth Control:   No contraception   01/10/2024 Kieth JAYSON Carolin, MD

## 2024-01-08 NOTE — Lactation Note (Signed)
 This note was copied from a baby's chart. Lactation Consultation Note  Patient Name: Susan Henderson Date: 01/08/2024 Age:20 hours Reason for consult: Initial assessment;1st time breastfeeding;Exclusive pumping and bottle feeding;Term.See MOB: MR- hx mild anemia  MOB informed LC her feeding choice is Pump Only and formula feeding infant. LC set MOB up with DEBP using 21 mm breast flange. MOB will continue to pump every 3 hours for 15 minutes on initial setting and give infant back any EBM first before offering infant formula. MOB will continue to feed infant by cues, 8-12 times within 24 hours. MOB knows that her EBM is safe for 4 hours at room temperature whereas formula must be used within 1 hour once opened. Infant is currently taking 15 mls per feeding of 20 kcal formula. LC discussed importance of maternal rest, balance meals & snacks, hydration. MOB was made aware of O/P services, breastfeeding support groups, community resources, and our phone # for post-discharge questions.    Current feeding plan: 1- MOB will continue to feed infant 8-12 times within 24 hours. 2- Will continue to use DEBP every 3 hours for 15 minutes on initial setting. MOB will give infant back any EBM first before formula. 3- MOB given handout Feeding Guide knows day one to offer 5-15 mls of EBM/ formula per feeding.  Maternal Data Has patient been taught Hand Expression?: No Does the patient have breastfeeding experience prior to this delivery?: No  Feeding Mother's Current Feeding Choice: Breast Milk and Formula  LATCH Score                    Lactation Tools Discussed/Used Tools: Pump;Flanges Flange Size: 21 Breast pump type: Double-Electric Breast Pump Pump Education: Setup, frequency, and cleaning;Milk Storage Reason for Pumping: MOB feedng choice is pump only and formula feed infant. Pumping frequency: Pump every 3 hours for 15 minutes on inital  setting.  Interventions Interventions: Breast feeding basics reviewed;Expressed milk;DEBP;Pace feeding;Guidelines for Milk Supply and Pumping Schedule Handout;LC Services brochure  Discharge Pump: DEBP;Personal  Consult Status Consult Status: Follow-up Date: 01/09/24 Follow-up type: In-patient    Susan Henderson 01/08/2024, 6:06 PM

## 2024-01-08 NOTE — Progress Notes (Signed)
 LABOR PROGRESS NOTE  Patient Name: Susan Henderson, female   DOB: Mar 16, 2006, 18 y.o.  MRN: [id number]  Patient continues to contract regularly and painfully. SROM 2350 with moderate meconium stained fluid. Patient declines check at this time. Is still contracting regularly and clinically appears to be progressing, so this is reasonable. Cat I tracing. Epidural PRN.  [name], MD

## 2024-01-08 NOTE — Progress Notes (Signed)
 Susan Henderson is a 18 y.o. G1P0 at [redacted]w[redacted]d by admitted for regular contractions.   Subjective: Patient is comfortable in bed with her epidural. She is requesting pitocin  augmentation.  Objective: BP 117/61   Pulse 73   Temp (!) 97.5 F (36.4 C) (Axillary)   Resp 18   Ht 5' 8 (1.727 m)   Wt 110 kg   LMP 03/26/2023 (Exact Date)   SpO2 100%   BMI 36.86 kg/m     FHT:  FHR: 125 bpm, variability: moderate,  accelerations:  Present,  decelerations:  Absent UC:   irregular, every 2-8 minutes SVE:   Dilation: 7 Effacement (%): 80 Station: -2 Exam by:: Zeev Deakins, CNM  Labs: Lab Results  Component Value Date   WBC 9.5 01/07/2024   HGB 12.3 01/07/2024   HCT 37.7 01/07/2024   MCV 85.9 01/07/2024   PLT 236 01/07/2024    Assessment / Plan: G1P0 IUP at [redacted]w[redacted]d in spontaneous labor. Contraction pattern spaced out since epidural placement. Cat 1 FHT  Labor: Pitocin  augmentation 1x1 for irregular and spaced out contraction pattern and per patient request. Preeclampsia:   N/A Fetal Wellbeing:  Category I Pain Control:  Epidural I/D:   GBS neg Anticipated MOD:  NSVD  Susan Henderson, Student-MidWife 01/08/2024, 10:24 AM

## 2024-01-08 NOTE — Anesthesia Procedure Notes (Signed)
 Epidural Patient location during procedure: OB Start time: 01/08/2024 4:35 AM End time: 01/08/2024 4:45 AM  Staffing Anesthesiologist: Niels Marien CROME, MD Performed: anesthesiologist   Preanesthetic Checklist Completed: patient identified, IV checked, risks and benefits discussed, monitors and equipment checked, pre-op evaluation and timeout performed  Epidural Patient position: sitting Prep: DuraPrep and site prepped and draped Patient monitoring: continuous pulse ox, blood pressure, heart rate and cardiac monitor Approach: midline Location: L3-L4 Injection technique: LOR air  Needle:  Needle type: Tuohy  Needle gauge: 17 G Needle length: 9 cm Needle insertion depth: 7 cm Catheter type: closed end flexible Catheter size: 19 Gauge Catheter at skin depth: 12 cm Test dose: negative  Assessment Sensory level: T8 Events: blood not aspirated, no cerebrospinal fluid, injection not painful, no injection resistance, no paresthesia and negative IV test  Additional Notes Patient identified. Risks/Benefits/Options discussed with patient including but not limited to bleeding, infection, nerve damage, paralysis, failed block, incomplete pain control, headache, blood pressure changes, nausea, vomiting, reactions to medication both or allergic, itching and postpartum back pain. Confirmed with bedside nurse the patient's most recent platelet count. Confirmed with patient that they are not currently taking any anticoagulation, have any bleeding history or any family history of bleeding disorders. Patient expressed understanding and wished to proceed. All questions were answered. Sterile technique was used throughout the entire procedure. Please see nursing notes for vital signs. Test dose was given through epidural catheter and negative prior to continuing to dose epidural or start infusion. Warning signs of high block given to the patient including shortness of breath, tingling/numbness in hands,  complete motor block, or any concerning symptoms with instructions to call for help. Patient was given instructions on fall risk and not to get out of bed. All questions and concerns addressed with instructions to call with any issues or inadequate analgesia.  Reason for block:procedure for pain

## 2024-01-09 NOTE — Lactation Note (Signed)
 This note was copied from a baby's chart. Lactation Consultation Note  Patient Name: Susan Henderson Date: 01/09/2024 Age:20 hours Reason for consult: Follow-up assessment;Exclusive pumping and bottle feeding Per MOB, pump she  was using broke, she received a replacement once visitors leave she plans to use pump. MOB mostly been formula feeding infant, Per MOB, infant is consume 30 mls per feeding but when looking at bottle infant had only consume 10 mls. LC discussed infant is now Day 2 and his intake should be (15-30 mls) per feeding, MOB has handout with infant feeding guidelines.   Maternal Data    Feeding Mother's Current Feeding Choice: Breast Milk and Formula Nipple Type: Slow - flow  LATCH Score                    Lactation Tools Discussed/Used    Interventions Interventions: Education;DEBP;Pace feeding  Discharge    Consult Status      Susan Henderson 01/09/2024, 5:54 PM

## 2024-01-09 NOTE — Progress Notes (Signed)
 CSW received and acknowledges consult for EDPS of 9.  Consult screened out due to 9 on EDPS does not warrant a CSW consult.  MOB whom scores are greater than 9/yes to question 10 on Edinburgh Postpartum Depression Screen warrants a CSW consult.   CSW received a consult for Edinburgh results of 9, and per chart review SDOH determined difficulty with housing, food and transportation issues. CSW met MOB at bedside to complete a mental health assessment and offer support. CSW entered the room, introduced herself and acknowledged that her mom and sister were present. MOB gave CSW verbal permission to speak about anything while her guest were present. CSW explained her role and the reason for the visit. MOB was polite, easy to engage, receptive to meeting with CSW, and appeared forthcoming.  CSW asked MOB about her mental health history. MOB reported being diagnosed with anxiety and depression in 2021. MOB reported being prescribed medication and participating in therapy in the past for support. MOB reported seeing Alfonso with [facility] for support during her pregnancy; and would return if needed during her PP period. CSW provided education regarding the baby blues period vs. perinatal mood disorders, discussed treatment and gave resources for mental health follow up if concerns arise.  CSW recommends self-evaluation during the postpartum time period using the New Mom Checklist from Postpartum Progress and encouraged MOB to contact a medical professional if symptoms are noted at any time. CSW assessed for safety with MOB SI/HI/DV;MOB denied all.   CSW asked MOB about the SDOH determing difficulties with housing, food and transportation. MOB reported difficulties with obtaining food stamps at the moment due to family factors; however she is working with a sports coach in order to obtain the support. MOB reported she will be living with her Mom at [address] [place] [address] and relying on her mom and  grandparents for transportation.   CSW asked MOB has she selected a pediatrician for the infant's follow up visits; MOB said [facility] [place]. MOB reported having all essential items for the infant including a carseat, bassinet and crib for safe sleeping. CSW provided review of Sudden Infant Death Syndrome (SIDS) precautions.   CSW identifies no further need for intervention and no barriers to discharge at this time.  Rosina Molt, [place] Clinical Social Worker [phone]

## 2024-01-09 NOTE — Progress Notes (Signed)
 Patient ID: Susan Henderson, female   DOB: 31-Dec-2005, 18 y.o.   MRN: [id number] POSTPARTUM PROGRESS NOTE  Post Partum Day 1  Subjective:  Susan Henderson is a 18 y.o. H5E7987 s/p SVD at [redacted]w[redacted]d.  No acute events overnight.  Pt denies problems with ambulating, voiding or po intake.  She denies nausea or vomiting.  Pain is moderately controlled.  She has had flatus. She has had bowel movement.  Lochia Moderate.   Objective: Blood pressure 114/72, pulse (!) 57, temperature 98.3 F (36.8 C), temperature source Oral, resp. rate 19, height 5' 6 (1.676 m), weight 99.3 kg, last menstrual period 10/04/2023, SpO2 100%.  Physical Exam:  General: alert, cooperative and no distress Chest: no respiratory distress Heart:regular rate, distal pulses intact Abdomen: soft, nontender Uterine Fundus: firm, appropriately tender DVT Evaluation: No calf swelling or tenderness Extremities: no edema Skin: warm, dry  Recent Labs    10/19/23 0908 10/20/23 0856  HGB 13.3 14.0  HCT 40.4 42.2    Assessment/Plan: Susan Henderson is a 18 y.o. H5E7987 s/p SVD at [redacted]w[redacted]d   PPD#1 - Doing well Contraception: none Feeding: both Dispo: Plan for discharge tomorrow.   LOS: 1 days   Susan JONETTA Bridge, MD 01/09/2024 7:56 AM

## 2024-01-10 MED ORDER — IBUPROFEN 600 MG PO TABS: 600.0000 mg | ORAL_TABLET | Freq: Four times a day (QID) | ORAL | 0 refills | Status: DC

## 2024-01-10 MED ORDER — SENNOSIDES-DOCUSATE SODIUM 8.6-50 MG PO TABS: 2.0000 | ORAL_TABLET | Freq: Every evening | ORAL | 0 refills | Status: DC | PRN

## 2024-01-10 MED ORDER — ACETAMINOPHEN 325 MG PO TABS: 650.0000 mg | ORAL_TABLET | ORAL | 0 refills | Status: AC | PRN

## 2024-01-10 NOTE — Lactation Note (Signed)
 This note was copied from a baby's chart. Lactation Consultation Note  Patient Name: Boy Kathelyn Gombos Unijb'd Date: 01/10/2024 Age:20 hours Reason for consult: Follow-up assessment;Primapara;1st time breastfeeding;Term;Infant weight loss (5 % weight loss) Mom mentioned to [facility] her nipples were sore. LC offered to assess and mom receptive. No breakdown noted. LC reviewed hand expressing and steps for latching. including reverse pressure exercise if she decides to latch. Provided coconut oil with pumping.  Reviewed BF D/C teaching and the [facility] resources.  LC provided coconut oil.   Maternal Data Has patient been taught Hand Expression?: Yes  Feeding Mother's Current Feeding Choice: Breast Milk and Formula Nipple Type: Slow - flow    Lactation Tools Discussed/Used Tools: Shells;Pump;Flanges Flange Size: 18;21 Breast pump type: Double-Electric Breast Pump;Manual Pump Education: Setup, frequency, and cleaning;Milk Storage  Interventions    Discharge Discharge Education: Engorgement and breast care;Warning signs for feeding baby Pump: Personal;DEBP;Manual  Consult Status Consult Status: Complete Date: 01/10/24    Rollene Jenkins Fiedler 01/10/2024, 11:26 AM

## 2024-01-10 NOTE — Lactation Note (Signed)
 This note was copied from a baby's chart. Lactation Consultation Note  Patient Name: Susan Henderson Date: 01/10/2024 Age:20 hours Reason for consult: Follow-up assessment;Mother's request;Term MOB decided to latch infant at the breast with this feeding, Infant very fussy LC gave 2 mls of 20 kcal formula using a spoon prior to latch, infant latched on MOB's right breast using the football hold position, infant was on and off the breast for 10 minutes. Per MOB, she felt tug but no pain with latch. MOB will continue to latch infant 1st every feeding to help stimulate and establish her milk supply. MOB will continue to BF infant every 2-3 hours, skin to skin. MOB will continue to work towards latching infant at the breast.   Maternal Data    Feeding Mother's Current Feeding Choice: Breast Milk and Formula Nipple Type: Slow - flow  LATCH Score Latch: Repeated attempts needed to sustain latch, nipple held in mouth throughout feeding, stimulation needed to elicit sucking reflex.  Audible Swallowing: A few with stimulation  Type of Nipple: Everted at rest and after stimulation  Comfort (Breast/Nipple): Soft / non-tender  Hold (Positioning): Assistance needed to correctly position infant at breast and maintain latch.  LATCH Score: 7   Lactation Tools Discussed/Used    Interventions Interventions: Skin to skin;Assisted with latch;Breast compression;Adjust position;Support pillows;Position options;Expressed milk;Education  Discharge    Consult Status Consult Status: Follow-up Date: 01/10/24 Follow-up type: In-patient    Susan Henderson 01/10/2024, 12:25 AM

## 2024-01-13 ENCOUNTER — Telehealth: Payer: Self-pay

## 2024-01-13 NOTE — Telephone Encounter (Signed)
Returned call and pt states that she has had no bleeding since vaginal delivery on 01/08/24. Pt reports a lot of yellow discharge with odor and vaginal burning. Pt denies pelvic pain, fever, chills......... .....had scheduler call pt for appt today at 3:10 but pt did not answer nor return call from the vm that was left by front staff. Pt was called again and pt could not make it here on time for appt. Offered pt alternative appts tomorrow but pt refused female providers. Pt advised next appt with female is on Friday, advised that if symptoms get worse before appt be evaluated mau immediately, pt agreed.

## 2024-01-19 ENCOUNTER — Ambulatory Visit (INDEPENDENT_AMBULATORY_CARE_PROVIDER_SITE_OTHER): Payer: 59 | Admitting: Obstetrics & Gynecology

## 2024-01-19 DIAGNOSIS — F53 Postpartum depression: Secondary | ICD-10-CM | POA: Diagnosis not present

## 2024-01-19 MED ORDER — ESCITALOPRAM OXALATE 20 MG PO TABS: 20.0000 mg | ORAL_TABLET | Freq: Every day | ORAL | 1 refills | Status: DC

## 2024-01-19 NOTE — Progress Notes (Unsigned)
    GYNECOLOGY PROGRESS NOTE  Subjective:    Patient ID: Susan Henderson, female    DOB: 2006/01/24, 18 y.o.   MRN: [id number]  HPI  Patient is a 18 y.o. G1P1001 11 days postpartum s/p NSVD with 2nd degree tear. She is here with complaints of a sore vulva. She is having minimal bleeding. Her mom is here with her and tells me that [name] is voicing concerns about her mothering skills. In the past she saw Behavioral Health due to GAD. She had some help with wellbutrin but did not take it for long. She denies HI and SI. She is willing to do a trial of lexapro and to return to B health.  The following portions of the patient's history were reviewed and updated as appropriate: allergies, current medications, past family history, past medical history, past social history, past surgical history, and problem list.  Review of Systems Pertinent items are noted in HPI.   Objective:   Blood pressure 115/82, pulse 93, weight 220 lb (99.8 kg), last menstrual period 01/17/2024, currently breastfeeding. Body mass index is 33.45 kg/m. General appearance: alert EG- laceration healing well, no evidence of infection/poor healing  Assessment:   1. Postpartum state   2. Postpartum depression      Plan:   1. Postpartum state (Primary) - start lexapro 20 mg qhs  2. Postpartum depression  - Ambulatory referral to Integrated Behavioral Health  She has an appt in the near future here

## 2024-01-19 NOTE — Progress Notes (Unsigned)
Pt presents for lower back and vaginal pain while walking. Started period on 2/15.

## 2024-02-13 ENCOUNTER — Ambulatory Visit: Payer: 59 | Admitting: Obstetrics & Gynecology

## 2024-02-13 NOTE — Progress Notes (Signed)
    Post Partum Visit Note  Susan Henderson is a 18 y.o. G66P1001 female who presents for a postpartum visit. She is 5 weeks postpartum following a normal spontaneous vaginal delivery.  I have fully reviewed the prenatal and intrapartum course. The delivery was at 41.1 gestational weeks.  Anesthesia: epidural. Postpartum course has been good. Pt states she has been having hot flashes, mostly when baby is crying.  Baby is doing well. Baby is feeding by bottle - Similac Sensitive RS. Bleeding thin lochia. Bowel function is normal. Bladder function is normal. Patient is not sexually active. Contraception method is abstinence.   Postpartum depression screening: negative, score 0.   The pregnancy intention screening data noted above was reviewed. Potential methods of contraception were discussed. The patient elected to proceed with No data recorded.    Health Maintenance Due  Topic Date Due   HPV VACCINES (1 - 3-dose series) Never done   COVID-19 Vaccine (1 - 2024-25 season) Never done    The following portions of the patient's history were reviewed and updated as appropriate: allergies, current medications, past family history, past medical history, past social history, past surgical history, and problem list.  Review of Systems Pertinent items are noted in HPI.  Objective:  LMP 01/17/2024    General:  alert, cooperative, and no distress   Breasts:  not indicated  Lungs:   Heart:  regular rate and rhythm  Abdomen: flat    Wound   GU exam:  not indicated       Assessment:    There are no diagnoses linked to this encounter.  normal postpartum exam.   Plan:   Essential components of care per ACOG recommendations:  1.  Mood and well being: Patient with negative depression screening today. Reviewed local resources for support.  - Patient tobacco use? No.   - hx of drug use? No.    2. Infant care and feeding:  -Patient currently breastmilk feeding? No.  -Social determinants of  health (SDOH) reviewed in EPIC. No concerns  3. Sexuality, contraception and birth spacing - Patient does not want a pregnancy in the next year.  Desired family size is 2 children.  - Reviewed reproductive life planning. Reviewed contraceptive methods based on pt preferences and effectiveness.  Patient desired Abstinence today.   - Discussed birth spacing of 18 months  4. Sleep and fatigue -Encouraged family/partner/community support of 4 hrs of uninterrupted sleep to help with mood and fatigue  5. Physical Recovery  - Discussed patients delivery and complications. She describes her labor as good. - Patient had a Vaginal, no problems at delivery. Patient had a 1st degree laceration. Perineal healing reviewed. Patient expressed understanding - Patient has urinary incontinence? No. - Patient is safe to resume physical and sexual activity  6.  Health Maintenance - HM due items addressed Yes - Last pap smear No results found for: "DIAGPAP" Pap smear not done at today's visit.  -Breast Cancer screening indicated? No.   7. Chronic Disease/Pregnancy Condition follow up: None  - PCP follow up  Adam Phenix, MD  Center for [facility], [facility]

## 2024-06-23 ENCOUNTER — Other Ambulatory Visit: Payer: Self-pay | Admitting: Medical Genetics

## 2024-09-14 ENCOUNTER — Ambulatory Visit

## 2024-09-14 VITALS — BP 126/80 | HR 79

## 2024-09-14 DIAGNOSIS — Z3201 Encounter for pregnancy test, result positive: Secondary | ICD-10-CM

## 2024-09-14 LAB — POCT URINE PREGNANCY: Preg Test, Ur: POSITIVE — AB

## 2024-09-14 MED ORDER — VITAFOL GUMMIES 3.33-0.333-34.8 MG PO CHEW: 1.0000 | CHEWABLE_TABLET | Freq: Every day | ORAL | 5 refills | Status: AC

## 2024-09-14 MED ORDER — PROMETHAZINE HCL 25 MG PO TABS: 25.0000 mg | ORAL_TABLET | Freq: Four times a day (QID) | ORAL | 1 refills | Status: AC | PRN

## 2024-09-14 NOTE — Progress Notes (Signed)
 Susan Henderson here for a UPT. Pt had a positive upt at home. LMP is 07/29/24.     UPT in office Positive.    Reviewed medications and informed to start a PNV, if not already. Pt to follow up in 3 weeks for New OB visit.

## 2024-09-15 ENCOUNTER — Other Ambulatory Visit: Payer: Self-pay | Admitting: Medical Genetics

## 2024-09-15 DIAGNOSIS — Z006 Encounter for examination for normal comparison and control in clinical research program: Secondary | ICD-10-CM

## 2024-10-04 ENCOUNTER — Other Ambulatory Visit (INDEPENDENT_AMBULATORY_CARE_PROVIDER_SITE_OTHER): Payer: Self-pay

## 2024-10-04 ENCOUNTER — Other Ambulatory Visit (HOSPITAL_COMMUNITY)
Admission: RE | Admit: 2024-10-04 | Discharge: 2024-10-04 | Disposition: A | Discharge status: Home / Self Care | Source: Ambulatory Visit | Attending: Obstetrics & Gynecology | Admitting: Obstetrics & Gynecology

## 2024-10-04 ENCOUNTER — Ambulatory Visit (INDEPENDENT_AMBULATORY_CARE_PROVIDER_SITE_OTHER): Admitting: *Deleted

## 2024-10-04 VITALS — BP 133/73 | HR 80 | Wt 229.2 lb

## 2024-10-04 DIAGNOSIS — Z3480 Encounter for supervision of other normal pregnancy, unspecified trimester: Secondary | ICD-10-CM | POA: Diagnosis present

## 2024-10-04 DIAGNOSIS — O3680X0 Pregnancy with inconclusive fetal viability, not applicable or unspecified: Secondary | ICD-10-CM

## 2024-10-04 DIAGNOSIS — Z3481 Encounter for supervision of other normal pregnancy, first trimester: Secondary | ICD-10-CM

## 2024-10-04 DIAGNOSIS — Z3A08 8 weeks gestation of pregnancy: Secondary | ICD-10-CM

## 2024-10-04 MED ORDER — BLOOD PRESSURE KIT DEVI: 1.0000 | 0 refills | Status: AC

## 2024-10-04 NOTE — Patient Instructions (Signed)

## 2024-10-04 NOTE — Progress Notes (Signed)
 New OB Intake  I connected with Susan Henderson  on 10/04/24 at 10:15 AM EST by In Person Visit and verified that I am speaking with the correct person using two identifiers. Nurse is located at [facility] and pt is located at [facility].  I discussed the limitations, risks, security and privacy concerns of performing an evaluation and management service by telephone and the availability of in person appointments. I also discussed with the patient that there may be a patient responsible charge related to this service. The patient expressed understanding and agreed to proceed.  I explained I am completing New OB Intake today. We discussed EDD of 05/13/25 based on US  at [redacted]w[redacted]d weeks. Pt is G2P1001. I reviewed her allergies, medications and Medical/Surgical/OB history.    Patient Active Problem List   Diagnosis Date Noted   Supervision of other normal pregnancy, antepartum 10/04/2024     Concerns addressed today  Delivery Plans Plans to deliver at [facility] Discussed the nature of our practice with multiple providers including residents and students as well as female and female providers. Due to the size of the practice, the delivering provider may not be the same as those providing prenatal care.   Patient is not interested in water birth.  MyChart/Babyscripts MyChart access verified. I explained pt will have some visits in office and some virtually. Babyscripts instructions given and order placed. Patient verifies receipt of registration text/e-mail. Account successfully created and app downloaded. If patient is a candidate for Optimized scheduling, add to sticky note.   Blood Pressure Cuff/Weight Scale Blood pressure cuff ordered for patient to pick-up from [employer]. Explained after first prenatal appt pt will check weekly and document in Babyscripts. Patient does not have weight scale; patient may purchase if they desire to track weight weekly in Babyscripts.  Anatomy US  Explained first  scheduled US  will be around 19 weeks. Anatomy US  scheduled for TBD at TBD.  First visit review I reviewed new OB appt with patient. Explained pt will be seen by Susan Daring, NP at first visit. Discussed Susan Henderson genetic screening with patient. Requests Panorama. Routine prenatal labs OB Urine, GC/CC collected at today's visit.   Last Pap No results found for: DIAGPAP  Susan CHRISTELLA Ober, RN 10/04/2024  11:10 AM

## 2024-10-05 LAB — CERVICOVAGINAL ANCILLARY ONLY
Chlamydia: POSITIVE — AB
Comment: NEGATIVE
Comment: NORMAL
Neisseria Gonorrhea: NEGATIVE

## 2024-10-06 ENCOUNTER — Other Ambulatory Visit: Payer: Self-pay

## 2024-10-06 LAB — URINE CULTURE, OB REFLEX

## 2024-10-06 LAB — CULTURE, OB URINE

## 2024-10-06 MED ORDER — AZITHROMYCIN 500 MG PO TABS: 1000.0000 mg | ORAL_TABLET | Freq: Once | ORAL | 0 refills | Status: AC

## 2024-10-08 ENCOUNTER — Ambulatory Visit: Payer: Self-pay | Admitting: Obstetrics and Gynecology

## 2024-10-08 DIAGNOSIS — A749 Chlamydial infection, unspecified: Secondary | ICD-10-CM | POA: Insufficient documentation

## 2024-10-08 MED ORDER — AZITHROMYCIN 500 MG PO TABS: 1000.0000 mg | ORAL_TABLET | Freq: Once | ORAL | 1 refills | Status: AC

## 2024-11-01 ENCOUNTER — Encounter: Admitting: Obstetrics and Gynecology

## 2024-11-11 ENCOUNTER — Other Ambulatory Visit (HOSPITAL_COMMUNITY): Admission: RE | Admit: 2024-11-11 | Discharge: 2024-11-11 | Disposition: A | Source: Ambulatory Visit

## 2024-11-11 ENCOUNTER — Encounter: Payer: Self-pay | Admitting: Obstetrics

## 2024-11-11 ENCOUNTER — Ambulatory Visit: Admitting: Obstetrics

## 2024-11-11 VITALS — BP 121/77 | HR 84 | Wt 243.8 lb

## 2024-11-11 DIAGNOSIS — O99341 Other mental disorders complicating pregnancy, first trimester: Secondary | ICD-10-CM

## 2024-11-11 DIAGNOSIS — Z3480 Encounter for supervision of other normal pregnancy, unspecified trimester: Secondary | ICD-10-CM

## 2024-11-11 DIAGNOSIS — Z3A13 13 weeks gestation of pregnancy: Secondary | ICD-10-CM | POA: Diagnosis not present

## 2024-11-11 DIAGNOSIS — F32A Depression, unspecified: Secondary | ICD-10-CM

## 2024-11-11 DIAGNOSIS — O99340 Other mental disorders complicating pregnancy, unspecified trimester: Secondary | ICD-10-CM

## 2024-11-11 NOTE — Progress Notes (Signed)
 Pt presents for new ob. Pt wants to change from Lexapro  to another medication.pt states that her whole body has been hurting for the last 2 days. Pt needs toc for chlamydia. No questions or concerns at this time.

## 2024-11-11 NOTE — Progress Notes (Signed)
 Subjective:    Susan Henderson is being seen today for her first obstetrical visit.  This is not a planned pregnancy. She is at [redacted]w[redacted]d gestation. Her obstetrical history is significant for none. Relationship with FOB: significant other, not living together. Patient does intend to breast feed. Pregnancy history fully reviewed.  The information documented in the HPI was reviewed and verified.  Menstrual History: OB History     Gravida  2   Para  1   Term  1   Preterm  0   AB  0   Living  1      SAB  0   IAB  0   Ectopic  0   Multiple  0   Live Births  1            Patient's last menstrual period was 07/29/2024.    Past Medical History:  Diagnosis Date   Medical history non-contributory     Past Surgical History:  Procedure Laterality Date   ADENOIDECTOMY     NASAL SEPTUM SURGERY     TONSILLECTOMY      (Not in a hospital admission)  Allergies[1]  Social History   Tobacco Use   Smoking status: Never   Smokeless tobacco: Never  Substance Use Topics   Alcohol use: Not Currently    Family History  Problem Relation Age of Onset   Healthy Mother    Healthy Father    Cancer Maternal Grandfather    Diabetes Maternal Grandfather    Hypertension Other      Review of Systems Constitutional: negative for weight loss Gastrointestinal: negative for vomiting Genitourinary:negative for genital lesions and vaginal discharge and dysuria Musculoskeletal:negative for back pain Behavioral/Psych: negative for abusive relationship, depression, illegal drug usage and tobacco use    Objective:    BP 121/77   Pulse 84   Wt 243 lb 12.8 oz (110.6 kg)   LMP 07/29/2024   BMI 37.07 kg/m  General Appearance:    Alert, cooperative, no distress, appears stated age  Head:    Normocephalic, without obvious abnormality, atraumatic  Eyes:    PERRL, conjunctiva/corneas clear, EOM's intact, fundi    benign, both eyes  Ears:    Normal TM's and external ear canals, both ears   Nose:   Nares normal, septum midline, mucosa normal, no drainage    or sinus tenderness  Throat:   Lips, mucosa, and tongue normal; teeth and gums normal  Neck:   Supple, symmetrical, trachea midline, no adenopathy;    thyroid:  no enlargement/tenderness/nodules; no carotid   bruit or JVD  Back:     Symmetric, no curvature, ROM normal, no CVA tenderness  Lungs:     Clear to auscultation bilaterally, respirations unlabored  Chest Wall:    No tenderness or deformity   Heart:    Regular rate and rhythm, S1 and S2 normal, no murmur, rub   or gallop  Breast Exam:    No tenderness, masses, or nipple abnormality  Abdomen:     Soft, non-tender, bowel sounds active all four quadrants,    no masses, no organomegaly  Genitalia:    Normal female without lesion, discharge or tenderness  Extremities:   Extremities normal, atraumatic, no cyanosis or edema  Pulses:   2+ and symmetric all extremities  Skin:   Skin color, texture, turgor normal, no rashes or lesions  Lymph nodes:   Cervical, supraclavicular, and axillary nodes normal  Neurologic:   CNII-XII intact, normal strength, sensation and  reflexes    throughout      Lab Review Urine pregnancy test Labs reviewed yes Radiologic studies reviewed no  Assessment:    Pregnancy at [redacted]w[redacted]d weeks    Plan:    1. Supervision of other normal pregnancy, antepartum (Primary) Rx: - CBC/D/Plt+RPR+Rh+ABO+RubIgG... - HgB A1c - PANORAMA PRENATAL TEST - Cervicovaginal ancillary only( [place])  2. Depression affecting pregnancy, antepartum Rx: - sertraline (ZOLOFT) 50 MG tablet; Take 1 tablet (50 mg total) by mouth daily.  Dispense: 30 tablet; Refill: 11 - Ambulatory referral to Integrated Behavioral Health   Prenatal vitamins.  Counseling provided regarding continued use of seat belts, cessation of alcohol consumption, smoking or use of illicit drugs; infection precautions i.e., influenza/TDAP immunizations, toxoplasmosis,CMV, parvovirus, listeria  and varicella; workplace safety, exercise during pregnancy; routine dental care, safe medications, sexual activity, hot tubs, saunas, pools, travel, caffeine use, fish and methlymercury, potential toxins, hair treatments, varicose veins Weight gain recommendations per IOM guidelines reviewed: underweight/BMI< 18.5--> gain 28 - 40 lbs; normal weight/BMI 18.5 - 24.9--> gain 25 - 35 lbs; overweight/BMI 25 - 29.9--> gain 15 - 25 lbs; obese/BMI >30->gain  11 - 20 lbs Problem list reviewed and updated. FIRST/CF mutation testing/NIPT/QUAD SCREEN/fragile X/Ashkenazi Jewish population testing/Spinal muscular atrophy discussed: requested. Role of ultrasound in pregnancy discussed; fetal survey: requested. Amniocentesis discussed: not indicated.  No orders of the defined types were placed in this encounter.  Orders Placed This Encounter  Procedures   CBC/D/Plt+RPR+Rh+ABO+RubIgG...    Release to patient:   Immediate   HgB A1c   PANORAMA PRENATAL TEST    ==========Department Information========== ID: [id number] Department:CENTER FOR [facility] AT [facility] [address], [address] [place] [address] Dept: [phone] Dept Fax: [phone]     Method/type of collection::   Clinic to manage sample collection    Expected due date (MM/DD/YYYY)::   05/13/2025    Is this a twin pregnancy? (viable, no vanished twin):   Not Twin Pregnancy, Gerri    Is this a surrogate or egg donor pregnancy?:   No    I want fetal sex included in the report::   Yes    Maternal Weight (lbs)::   243.8    Which Microdeletion Panel should be ordered?:   22q11.2 Deletion    What type of billing?:   Automatic Data    By placing this electronic order I confirm the testing ordered herein is medically necessary and this patient has been informed of the details of the genetic test(s) ordered, including the risks, benefits, and alternatives, and has consented to  testing.:   Yes    Select an order diagnosis: For additional options refer to [email or web address].[email or web address]:   Encounter for supervision of other normal pregnancy in second trimester [[id number]]    Follow up in 4 weeks.  I have spent a total of 25 minutes of face-to-face time, excluding clinical staff time, reviewing notes and preparing to see patient, ordering tests and/or medications, and counseling the patient.   CARLIN RONAL CENTERS, MD, FACOG Attending Obstetrician & Gynecologist, [facility] for [facility], [facility], [facility] 112/10/2024     [1]  Allergies Allergen Reactions   Ibuprofen  Nausea And Vomiting    Has been able to take postpartum without side effects

## 2024-11-12 LAB — CBC/D/PLT+RPR+RH+ABO+RUBIGG...
Antibody Screen: NEGATIVE
Basophils Absolute: 0 x10E3/uL (ref 0.0–0.2)
Basos: 1 %
EOS (ABSOLUTE): 0.2 x10E3/uL (ref 0.0–0.4)
Eos: 2 %
HCV Ab: NONREACTIVE
HIV Screen 4th Generation wRfx: NONREACTIVE
Hematocrit: 36 % (ref 34.0–46.6)
Hemoglobin: 12.2 g/dL (ref 11.1–15.9)
Hepatitis B Surface Ag: NEGATIVE
Immature Grans (Abs): 0 x10E3/uL (ref 0.0–0.1)
Immature Granulocytes: 0 %
Lymphocytes Absolute: 1.4 x10E3/uL (ref 0.7–3.1)
Lymphs: 18 %
MCH: 30.3 pg (ref 26.6–33.0)
MCHC: 33.9 g/dL (ref 31.5–35.7)
MCV: 89 fL (ref 79–97)
Monocytes Absolute: 0.6 x10E3/uL (ref 0.1–0.9)
Monocytes: 7 %
Neutrophils Absolute: 5.8 x10E3/uL (ref 1.4–7.0)
Neutrophils: 72 %
Platelets: 225 x10E3/uL (ref 150–450)
RBC: 4.03 x10E6/uL (ref 3.77–5.28)
RDW: 12.5 % (ref 11.7–15.4)
RPR Ser Ql: NONREACTIVE
Rh Factor: POSITIVE
Rubella Antibodies, IGG: <0.9 {index} — ABNORMAL LOW (ref >0.99)
WBC: 8 x10E3/uL (ref 3.4–10.8)

## 2024-11-12 LAB — CERVICOVAGINAL ANCILLARY ONLY
Bacterial Vaginitis (gardnerella): POSITIVE — AB
Candida Glabrata: NEGATIVE
Candida Vaginitis: NEGATIVE
Chlamydia: NEGATIVE
Comment: NEGATIVE
Comment: NEGATIVE
Comment: NEGATIVE
Comment: NEGATIVE
Comment: NEGATIVE
Comment: NORMAL
Neisseria Gonorrhea: NEGATIVE
Trichomonas: POSITIVE — AB

## 2024-11-12 LAB — HEMOGLOBIN A1C
Est. average glucose Bld gHb Est-mCnc: 91 mg/dL
Hgb A1c MFr Bld: 4.8 % (ref 4.8–5.6)

## 2024-11-12 LAB — HCV INTERPRETATION

## 2024-11-13 ENCOUNTER — Ambulatory Visit: Payer: Self-pay | Admitting: Obstetrics

## 2024-11-13 DIAGNOSIS — B9689 Other specified bacterial agents as the cause of diseases classified elsewhere: Secondary | ICD-10-CM

## 2024-11-13 MED ORDER — METRONIDAZOLE 500 MG PO TABS: 500.0000 mg | ORAL_TABLET | Freq: Two times a day (BID) | ORAL | 2 refills | Status: AC

## 2024-11-16 NOTE — Telephone Encounter (Signed)
 Called patient to make aware of test results and prescription sent in.  Erminio DELENA Rumps, RN

## 2024-11-16 NOTE — Telephone Encounter (Signed)
-----   Message from Carlin Centers, MD sent at 11/13/2024  7:17 AM EST ----- Flagyl  Rx for BV and Trichomonas.

## 2024-11-18 LAB — PANORAMA PRENATAL TEST FULL PANEL:PANORAMA TEST PLUS 5 ADDITIONAL MICRODELETIONS: FETAL FRACTION: 9

## 2024-12-09 ENCOUNTER — Ambulatory Visit (INDEPENDENT_AMBULATORY_CARE_PROVIDER_SITE_OTHER): Payer: Self-pay | Admitting: Obstetrics and Gynecology

## 2024-12-09 VITALS — BP 118/76 | HR 93 | Wt 239.0 lb

## 2024-12-09 DIAGNOSIS — O99342 Other mental disorders complicating pregnancy, second trimester: Secondary | ICD-10-CM

## 2024-12-09 DIAGNOSIS — A749 Chlamydial infection, unspecified: Secondary | ICD-10-CM

## 2024-12-09 DIAGNOSIS — O98812 Other maternal infectious and parasitic diseases complicating pregnancy, second trimester: Secondary | ICD-10-CM

## 2024-12-09 DIAGNOSIS — Z3A17 17 weeks gestation of pregnancy: Secondary | ICD-10-CM

## 2024-12-09 DIAGNOSIS — A5901 Trichomonal vulvovaginitis: Secondary | ICD-10-CM | POA: Insufficient documentation

## 2024-12-09 DIAGNOSIS — O23592 Infection of other part of genital tract in pregnancy, second trimester: Secondary | ICD-10-CM

## 2024-12-09 DIAGNOSIS — Z3480 Encounter for supervision of other normal pregnancy, unspecified trimester: Secondary | ICD-10-CM

## 2024-12-09 DIAGNOSIS — F32A Depression, unspecified: Secondary | ICD-10-CM | POA: Diagnosis not present

## 2024-12-09 DIAGNOSIS — O99340 Other mental disorders complicating pregnancy, unspecified trimester: Secondary | ICD-10-CM | POA: Insufficient documentation

## 2024-12-09 NOTE — Progress Notes (Addendum)
 Susan Henderson, Pt will start taking Rx for +Trich today, she has a seven day supply. Pt declined AFP test today.

## 2024-12-09 NOTE — Progress Notes (Signed)
 "  PRENATAL VISIT NOTE  Subjective:  Susan Henderson is a 19 y.o. G2P1001 at [redacted]w[redacted]d being seen today for ongoing prenatal care.  She is currently monitored for the following issues for this high-risk pregnancy and has Supervision of other normal pregnancy, antepartum; Hyperinsulinemia; Metabolic syndrome; Snoring; Nasal obstruction; Chlamydia infection affecting pregnancy; Trichomonal vaginitis during pregnancy; and Depression affecting pregnancy, antepartum on their problem list.  Patient reports no complaints.  Contractions: Not present. Vag. Bleeding: None.  Movement: Present. Denies leaking of fluid.   The following portions of the patient's history were reviewed and updated as appropriate: allergies, current medications, past family history, past medical history, past social history, past surgical history and problem list.   Objective:   Vitals:   12/09/24 1457  BP: 118/76  Pulse: 93  Weight: 239 lb (108.4 kg)    Fetal Status:  Fetal Heart Rate (bpm): 156   Movement: Present    General: Alert, oriented and cooperative. Patient is in no acute distress.  Skin: Skin is warm and dry. No rash noted.   Cardiovascular: Normal heart rate noted  Respiratory: Normal respiratory effort, no problems with respiration noted  Abdomen: Soft, gravid, appropriate for gestational age.  Pain/Pressure: Absent     Pelvic: Cervical exam deferred        Extremities: Normal range of motion.  Edema: None  Mental Status: Normal mood and affect. Normal behavior. Normal judgment and thought content.      11/11/2024    4:40 PM 10/04/2024   11:02 AM 10/08/2023    9:32 AM  Depression screen PHQ 2/9  Decreased Interest 0 0 1  Down, Depressed, Hopeless 0 0 1  PHQ - 2 Score 0 0 2  Altered sleeping 0 0 0  Tired, decreased energy 0 0 2  Change in appetite 0 0 3  Feeling bad or failure about yourself  0 0 1  Trouble concentrating 0 0 2  Moving slowly or fidgety/restless 0 0 0  Suicidal thoughts 0 0 0  PHQ-9  Score 0 0  10   Difficult doing work/chores   Not difficult at all     Data saved with a previous flowsheet row definition        11/11/2024    4:40 PM 10/04/2024   11:02 AM 10/08/2023    9:34 AM  GAD 7 : Generalized Anxiety Score  Nervous, Anxious, on Edge 0 0 1  Control/stop worrying 0 0 2  Worry too much - different things 0 0 2  Trouble relaxing 0 0 2  Restless 0 0 1  Easily annoyed or irritable 0 0 3  Afraid - awful might happen 0 0 3  Total GAD 7 Score 0 0 14  Anxiety Difficulty   Somewhat difficult    Assessment and Plan:  Pregnancy: G2P1001 at [redacted]w[redacted]d 1. Supervision of other normal pregnancy, antepartum (Primary) Anticipatory guidance Anatomy scheduled Declines AFP  2. Depression affecting pregnancy, antepartum Hasn't started zoloft yet Has been referred to [facility] but states she hasn't followed up Support given  3. Chlamydia infection affecting pregnancy, antepartum TOC neg  4. Trichomonal vaginitis during pregnancy, antepartum Has not completed treatment yet, has taken 1 dose Retest next visit  5. [redacted] weeks gestation of pregnancy    Preterm labor symptoms and general obstetric precautions including but not limited to vaginal bleeding, contractions, leaking of fluid and fetal movement were reviewed in detail with the patient. Please refer to After Visit Summary for other counseling recommendations.  Return in about 4 weeks (around 01/06/2025).  Future Appointments  Date Time Provider Department Center  01/06/2025 10:00 AM [facility] PROVIDER 1 [facility]  01/06/2025 10:30 AM [facility] US3 [facility]    Rollo ONEIDA Bring, MD  "

## 2024-12-24 ENCOUNTER — Other Ambulatory Visit

## 2024-12-24 ENCOUNTER — Ambulatory Visit

## 2025-01-06 ENCOUNTER — Ambulatory Visit: Admitting: Obstetrics

## 2025-01-06 ENCOUNTER — Encounter: Payer: Self-pay | Admitting: Physician Assistant

## 2025-01-06 ENCOUNTER — Ambulatory Visit (HOSPITAL_BASED_OUTPATIENT_CLINIC_OR_DEPARTMENT_OTHER)

## 2025-01-06 VITALS — BP 115/59 | HR 66

## 2025-01-06 DIAGNOSIS — Z3A21 21 weeks gestation of pregnancy: Secondary | ICD-10-CM

## 2025-01-06 DIAGNOSIS — Z3480 Encounter for supervision of other normal pregnancy, unspecified trimester: Secondary | ICD-10-CM | POA: Diagnosis not present

## 2025-01-06 DIAGNOSIS — O99212 Obesity complicating pregnancy, second trimester: Secondary | ICD-10-CM | POA: Diagnosis not present

## 2025-01-06 DIAGNOSIS — O09892 Supervision of other high risk pregnancies, second trimester: Secondary | ICD-10-CM

## 2025-01-06 DIAGNOSIS — A749 Chlamydial infection, unspecified: Secondary | ICD-10-CM

## 2025-01-06 DIAGNOSIS — O99213 Obesity complicating pregnancy, third trimester: Secondary | ICD-10-CM

## 2025-01-06 NOTE — Progress Notes (Signed)
 MFM Consult Note  Susan Henderson is currently at [redacted]w[redacted]d. She was seen today for a detailed fetal anatomy scan due to maternal obesity with a BMI of 37 and a short interval pregnancy..  She denies any significant past medical history and denies any problems in her current pregnancy.    She had a cell free DNA test earlier in her pregnancy which indicated a low risk for trisomy 82, 57, and 13. A female fetus is predicted.   Sonographic findings Single intrauterine pregnancy at 21w 6d. Fetal cardiac activity:  Observed and appears normal. Presentation: Variable. The views of the fetal anatomy that were visualized today appeared within normal limits.  Today's exam was limited due to maternal body habitus and the fetal position. Fetal biometry shows the estimated fetal weight of 1 lb 1 oz, 482 grams (61%). Amniotic fluid: Within normal limits.  MVP: 5.6 cm. Placenta: Anterior. Adnexa: No abnormality visualized. Cervical length: 3 cm.  The patient was informed that anomalies may be missed due to technical limitations. If the fetus is in a suboptimal position or maternal habitus is increased, visualization of the fetus in the maternal uterus may be impaired.  Due to maternal obesity and her short interval pregnancy, we will continue to follow her with growth ultrasounds throughout her pregnancy.    A follow-up exam was scheduled in 7 weeks.  The patient stated that all of her questions were answered.   A total of 20 minutes was spent counseling and coordinating the care for this patient.  Greater than 50% of the time was spent in direct face-to-face contact.

## 2025-01-18 ENCOUNTER — Encounter: Admitting: Family Medicine

## 2025-02-03 ENCOUNTER — Encounter: Payer: Self-pay | Admitting: Obstetrics and Gynecology

## 2025-02-24 ENCOUNTER — Ambulatory Visit
